# Patient Record
Sex: Female | Born: 1970 | Race: White | Hispanic: No | Marital: Married | Smoking: Never smoker
Health system: Southern US, Community
[De-identification: ages and names within clinical notes are randomized; demographics above are authoritative.]

## PROBLEM LIST (undated history)

## (undated) DIAGNOSIS — R011 Cardiac murmur, unspecified: Secondary | ICD-10-CM

## (undated) DIAGNOSIS — Z8669 Personal history of other diseases of the nervous system and sense organs: Secondary | ICD-10-CM

## (undated) DIAGNOSIS — Z889 Allergy status to unspecified drugs, medicaments and biological substances status: Secondary | ICD-10-CM

## (undated) DIAGNOSIS — T7840XA Allergy, unspecified, initial encounter: Secondary | ICD-10-CM

## (undated) DIAGNOSIS — Z8709 Personal history of other diseases of the respiratory system: Secondary | ICD-10-CM

## (undated) DIAGNOSIS — F419 Anxiety disorder, unspecified: Secondary | ICD-10-CM

## (undated) HISTORY — DX: Cardiac murmur, unspecified: R01.1

## (undated) HISTORY — DX: Personal history of other diseases of the nervous system and sense organs: Z86.69

## (undated) HISTORY — DX: Anxiety disorder, unspecified: F41.9

## (undated) HISTORY — DX: Allergy, unspecified, initial encounter: T78.40XA

## (undated) HISTORY — DX: Allergy status to unspecified drugs, medicaments and biological substances: Z88.9

## (undated) HISTORY — DX: Personal history of other diseases of the respiratory system: Z87.09

## (undated) HISTORY — PX: EYE SURGERY: SHX253

## (undated) HISTORY — PX: NASAL SEPTUM SURGERY: SHX37

---

## 2003-02-14 ENCOUNTER — Other Ambulatory Visit: Admission: RE | Admit: 2003-02-14 | Discharge: 2003-02-14 | Payer: Self-pay | Admitting: Gynecology

## 2004-10-20 ENCOUNTER — Ambulatory Visit: Payer: Self-pay | Admitting: Ophthalmology

## 2007-02-23 ENCOUNTER — Ambulatory Visit: Payer: Self-pay | Admitting: Family Medicine

## 2007-02-28 ENCOUNTER — Ambulatory Visit: Payer: Self-pay | Admitting: Family Medicine

## 2007-02-28 ENCOUNTER — Other Ambulatory Visit: Admission: RE | Admit: 2007-02-28 | Discharge: 2007-02-28 | Payer: Self-pay | Admitting: Family Medicine

## 2007-02-28 ENCOUNTER — Encounter: Payer: Self-pay | Admitting: Family Medicine

## 2007-02-28 DIAGNOSIS — D649 Anemia, unspecified: Secondary | ICD-10-CM | POA: Insufficient documentation

## 2007-03-03 ENCOUNTER — Encounter (INDEPENDENT_AMBULATORY_CARE_PROVIDER_SITE_OTHER): Payer: Self-pay | Admitting: *Deleted

## 2010-06-12 ENCOUNTER — Ambulatory Visit: Payer: Self-pay | Admitting: Family Medicine

## 2010-06-12 DIAGNOSIS — J309 Allergic rhinitis, unspecified: Secondary | ICD-10-CM

## 2010-07-22 ENCOUNTER — Encounter: Payer: Self-pay | Admitting: Family Medicine

## 2010-07-22 ENCOUNTER — Encounter (INDEPENDENT_AMBULATORY_CARE_PROVIDER_SITE_OTHER): Payer: Self-pay | Admitting: *Deleted

## 2010-09-01 ENCOUNTER — Telehealth: Payer: Self-pay | Admitting: Family Medicine
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
## 2010-09-24 NOTE — Letter (Signed)
Summary: Results Follow up Letter  [place] [facility]  [address] [place], [place] [address]   Phone: [phone]  Fax: [phone]    07/22/2010 MRN: [id number]  [facility] [address] [place], [place]  [address]  Dear Maria Thornton,  The following are the results of your recent test(s):  Test         Result    Pap Smear:        Normal __X___  Not Normal _____ Comments: ______________________________________________________ Cholesterol: LDL(Bad cholesterol):         Your goal is less than:         HDL (Good cholesterol):       Your goal is more than: Comments:  ______________________________________________________ Mammogram:        Normal _____  Not Normal _____ Comments:  ___________________________________________________________________ Hemoccult:        Normal _____  Not normal _______ Comments:    _____________________________________________________________________ Other Tests:    We routinely do not discuss normal results over the telephone.  If you desire a copy of the results, or you have any questions about this information we can discuss them at your next office visit.   Sincerely,      Sharilyn Sites for Dr. Roxy Manns

## 2010-09-24 NOTE — Letter (Signed)
Summary: Patient Questionnaire  Patient Questionnaire   Imported By: Beau Fanny 06/12/2010 15:13:14  _____________________________________________________________________  External Attachment:    Type:   Image     Comment:   External Document

## 2010-09-24 NOTE — Miscellaneous (Signed)
  Clinical Lists Changes  Observations: Added new observation of PAP SMEAR: normal (06/12/2010 11:31)      Preventive Care Screening  Pap Smear:    Date:  06/12/2010    Results:  normal

## 2010-09-24 NOTE — Progress Notes (Signed)
Summary: Fluticasone prop 50 mcg spray  Phone Note Refill Request Call back at fax [phone] Message from:  [facility] on September 01, 2010 11:15 AM  Refills Requested: Medication #1:  FLONASE 50 MCG/ACT SUSP 2 sprays in each nostril once daily. [facility] faxed refill request for fluticasone Prop spray #90 day supply. Spoke with [facility] to see if they had rx. Nicki Gu[place]ara said [facility] filled on 06/12/10.Unable to reach pt by phone.Please advise.  Faxed form is on your shelf in the in box.   Method Requested: Fax to Mail Away Pharmacy Initial call taken by: Lewanda Rife LPN,  September 01, 2010 11:16 AM  Follow-up for Phone Call        form done and in nurse in box  Follow-up by: Judith Part MD,  September 01, 2010 11:21 AM  Additional Follow-up for Phone Call Additional follow up Details #1::        completed form faxed to [phone] as instructed.Lewanda Rife LPN  September 01, 2010 2:16 PM     Prescriptions: FLONASE 50 MCG/ACT SUSP (FLUTICASONE PROPIONATE) 2 sprays in each nostril once daily  #3 mdi x 3   Entered and Authorized by:   Judith Part MD   Signed by:   Lewanda Rife LPN on 84/69/6295   Method used:   Historical   RxID:   [id number]

## 2011-06-08 ENCOUNTER — Ambulatory Visit (INDEPENDENT_AMBULATORY_CARE_PROVIDER_SITE_OTHER): Payer: 59 | Admitting: Family Medicine

## 2011-06-08 ENCOUNTER — Encounter: Payer: Self-pay | Admitting: Family Medicine

## 2011-06-08 VITALS — BP 116/72 | HR 80 | Temp 97.8°F | Ht 64.0 in | Wt 164.2 lb

## 2011-06-08 DIAGNOSIS — D649 Anemia, unspecified: Secondary | ICD-10-CM

## 2011-06-08 DIAGNOSIS — F419 Anxiety disorder, unspecified: Secondary | ICD-10-CM | POA: Insufficient documentation

## 2011-06-08 DIAGNOSIS — Z0000 Encounter for general adult medical examination without abnormal findings: Secondary | ICD-10-CM

## 2011-06-08 DIAGNOSIS — R5383 Other fatigue: Secondary | ICD-10-CM | POA: Insufficient documentation

## 2011-06-08 DIAGNOSIS — R5381 Other malaise: Secondary | ICD-10-CM

## 2011-06-08 DIAGNOSIS — R531 Weakness: Secondary | ICD-10-CM | POA: Insufficient documentation

## 2011-06-08 DIAGNOSIS — F411 Generalized anxiety disorder: Secondary | ICD-10-CM

## 2011-06-08 NOTE — Assessment & Plan Note (Signed)
Anemic in past from menses - will re check cbc with diff

## 2011-06-08 NOTE — Patient Instructions (Signed)
We will do a counseling referral at check out  Labs today Start eating breakfast with protein every day  Think about getting back to exercise Try to get to bed earlier- you need more sleep  Follow up with me in about a month  Blood pressure is fine

## 2011-06-08 NOTE — Assessment & Plan Note (Signed)
This may be fueling her episodes of weakness and other physical symptoms Rev to counselor Disc symptoms/ coping skills/ stressors/ tx opt in detail  F/u in about a mo - if not imp consider trial of zoloft again  >25 min spent with face to face with patient, >50% counseling and/or coordinating care

## 2011-06-08 NOTE — Assessment & Plan Note (Signed)
Labs for this and weakness today  Will disc at f/u  Disc need for more sleep and more regular meals and exercise as well
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
## 2011-06-08 NOTE — Assessment & Plan Note (Signed)
See assessment for fatigue and anxity - likely multifactorial Nl exam  Lab today and disc at f/u

## 2011-06-23 ENCOUNTER — Ambulatory Visit (INDEPENDENT_AMBULATORY_CARE_PROVIDER_SITE_OTHER): Payer: 59 | Admitting: Psychology

## 2011-06-23 DIAGNOSIS — F411 Generalized anxiety disorder: Secondary | ICD-10-CM

## 2011-07-07 ENCOUNTER — Ambulatory Visit (INDEPENDENT_AMBULATORY_CARE_PROVIDER_SITE_OTHER): Payer: 59 | Admitting: Psychology

## 2011-07-07 DIAGNOSIS — F411 Generalized anxiety disorder: Secondary | ICD-10-CM

## 2011-07-08 ENCOUNTER — Encounter: Payer: Self-pay | Admitting: Family Medicine

## 2011-07-09 ENCOUNTER — Ambulatory Visit (INDEPENDENT_AMBULATORY_CARE_PROVIDER_SITE_OTHER): Payer: 59 | Admitting: Family Medicine

## 2011-07-09 ENCOUNTER — Encounter: Payer: Self-pay | Admitting: Family Medicine

## 2011-07-09 VITALS — BP 110/78 | HR 68 | Temp 98.1°F | Ht 64.0 in | Wt 164.2 lb

## 2011-07-09 DIAGNOSIS — R531 Weakness: Secondary | ICD-10-CM

## 2011-07-09 DIAGNOSIS — F411 Generalized anxiety disorder: Secondary | ICD-10-CM

## 2011-07-09 DIAGNOSIS — R5381 Other malaise: Secondary | ICD-10-CM

## 2011-07-09 DIAGNOSIS — J309 Allergic rhinitis, unspecified: Secondary | ICD-10-CM

## 2011-07-09 DIAGNOSIS — R42 Dizziness and giddiness: Secondary | ICD-10-CM

## 2011-07-09 DIAGNOSIS — F419 Anxiety disorder, unspecified: Secondary | ICD-10-CM

## 2011-07-09 NOTE — Progress Notes (Signed)
Subjective:    Patient ID: Maria Thornton, female    DOB: 01-18-1971, 40 y.o.   MRN: [id number]  HPI Here for f/u of anxiety and weakness Doing some better  Seeing Dr Laymond Purser  Things at work have settled down too- less stress Is a lifetime worrier- and disc this with the counselor   Still has episodes of dizziness- ?weakness/ wondered about an inner ear problem  Off balance for just a few seconds   no cp or palpitations  ? If positional -- hard to say   Takes flonase -- for her sinuses and that is helpful  No facial pain or fever  Still stays congested a good part of the time / and sometimes dizzy   Full panel of labs from [facility] were all nl - chem and tsh and cbc and lipids  Reviewed those with pt today   Patient Active Problem List  Diagnoses  . ANEMIA NOS  . ALLERGIC RHINITIS  . Fatigue  . Weakness  . Anxiety  . Routine general medical examination at a health care facility  . Dizziness   Past Medical History  Diagnosis Date  . History of asthma     in the past, very mild  . Multiple allergies   . Heart murmur     no proph  . History of Bell's palsy     years ago   Past Surgical History  Procedure Date  . Nasal septum surgery   . Eye surgery   ?   History  Substance Use Topics  . Smoking status: Never Smoker   . Smokeless tobacco: Not on file  . Alcohol Use: Yes   Family History  Problem Relation Age of Onset  . Arthritis Mother     rheumatoid  . Hypertension Mother   . Hypertension Father   . Diabetes Maternal Grandmother   . Arthritis Maternal Grandfather     rheumatoid  . Hyperlipidemia Maternal Grandfather   . Hypertension Maternal Grandfather   . Arthritis Paternal Grandmother    Allergies  Allergen Reactions  . Sulfonamide Derivatives     REACTION: rash   Current Outpatient Prescriptions on File Prior to Visit  Medication Sig Dispense Refill  . clindamycin-benzoyl peroxide (BENZACLIN) gel Apply 1 application topically daily.         . fluticasone (FLONASE) 50 MCG/ACT nasal spray 2 sprays each nostril once daily       . spironolactone (ALDACTONE) 25 MG tablet Take 50 mg by mouth daily.       . Omega-3 Fatty Acids (FISH OIL) 1000 MG CAPS Take 2 capsules by mouth daily when remember           Review of Systems Review of Systems  Constitutional: Negative for fever, appetite change, fatigue and unexpected weight change.  Eyes: Negative for pain and visual disturbance.  ENT pos for congestion/ ear pressure, neg for ST or sinus pain  Respiratory: Negative for cough and shortness of breath.   Cardiovascular: Negative for cp or palpitations    Gastrointestinal: Negative for nausea, diarrhea and constipation.  Genitourinary: Negative for urgency and frequency.  Skin: Negative for pallor or rash   Neurological: Negative for weakness, , numbness and headaches. pos for episodes of light headedness  Hematological: Negative for adenopathy. Does not bruise/bleed easily.  Psychiatric/Behavioral: Negative for dysphoric mood. The patient has some anxiety and stress         Objective:   Physical Exam  Constitutional: She appears well-developed and well-nourished.  No distress.  HENT:  Head: Normocephalic and atraumatic.  Right Ear: External ear normal.  Left Ear: External ear normal.  Mouth/Throat: Oropharynx is clear and moist.       Nares are boggy , not congested TMs clear No sinus tenderness Some post nasal drip  Eyes: Conjunctivae and EOM are normal. Pupils are equal, round, and reactive to light. No scleral icterus.       No nystagmus  Neck: Normal range of motion. Neck supple. No JVD present. Carotid bruit is not present. No thyromegaly present.  Cardiovascular: Normal rate, regular rhythm, normal heart sounds and intact distal pulses.  Exam reveals no gallop.   Pulmonary/Chest: Effort normal and breath sounds normal. No respiratory distress. She has no wheezes.  Abdominal: Soft. Bowel sounds are normal. She exhibits  no distension and no mass. There is no tenderness.  Musculoskeletal: She exhibits no edema.  Lymphadenopathy:    She has no cervical adenopathy.  Neurological: She is alert. She has normal strength and normal reflexes. She displays no atrophy and no tremor. No cranial nerve deficit or sensory deficit. She exhibits normal muscle tone. Coordination and gait normal.  Skin: Skin is warm and dry. No rash noted. No erythema. No pallor.  Psychiatric: She has a normal mood and affect.          Assessment & Plan:

## 2011-07-09 NOTE — Assessment & Plan Note (Signed)
Pt seeing counselor -imp in stress , overall doing better  Will call if worse and consider zoloft

## 2011-07-09 NOTE — Assessment & Plan Note (Signed)
See assessment for dizziness

## 2011-07-09 NOTE — Assessment & Plan Note (Signed)
Intermittent with feeling of leg weakness Pt thinks this is vertigo and rel to allergies ? Still wonder if rel to anx Ref to ENT and update

## 2011-07-09 NOTE — Assessment & Plan Note (Signed)
On flonase- pt still having problems with sinus congestion/some ha and dizziness Ref to ENT

## 2011-07-09 NOTE — Patient Instructions (Signed)
Go ahead and get your eyes checked out  We will refer you to ENT at check out  If symptoms worsen- let me know  If your anxiety worsens- please let me know

## 2011-07-27 ENCOUNTER — Ambulatory Visit: Payer: Self-pay | Admitting: Unknown Physician Specialty

## 2011-08-23 ENCOUNTER — Telehealth: Payer: Self-pay | Admitting: Family Medicine

## 2011-08-23 NOTE — Telephone Encounter (Signed)
Thanks for the update - she can call us back if she changes her mind

## 2011-11-25 ENCOUNTER — Telehealth: Payer: Self-pay | Admitting: Family Medicine

## 2011-11-25 NOTE — Telephone Encounter (Signed)
Caller: Maria Thornton/Patient; PCP: Roxy Manns A.; CB#: [phone] regarding Feels Like Theres A. Lot of Paper Cuts On Hands for the Past Three Days; Sxs onset 11/23/11, feels like glass slivers in hands or paper cuts or very cold hands, is painful.  Goes all the way up to wrist at times, cold makes sxs worse, nothing makes it better.  Both hands, left hand more than right.  All emergent sxs r/o per "Hand Non-Injury" protocol with the exception of "New onset mild to moderate pain that has not improved with 24 hours of home care."  Advised to See Provider within 24 hours.  Attempted to schedule appt in [facility], all providers were full.  Advised to call in AM to see if any appts opened up, to call back sooner if sxs worsen or if concerned.  Verbalizes understanding.

## 2011-11-26 NOTE — Telephone Encounter (Signed)
Patient called and left a message on voicemail stated that she does not need an appointment, she is feeling better.  She is taking Ibuprofen for the pain and it is helping.

## 2011-11-26 NOTE — Telephone Encounter (Signed)
Please schedule her for sat clinic if nothing is available I am out of the office in a conference right now

## 2011-11-26 NOTE — Telephone Encounter (Signed)
Left message on cell phone voicemail for patient to return call. 

## 2012-02-14 ENCOUNTER — Telehealth: Payer: Self-pay | Admitting: Family Medicine

## 2012-02-14 NOTE — Telephone Encounter (Signed)
error 

## 2012-03-17 ENCOUNTER — Other Ambulatory Visit: Payer: Self-pay | Admitting: Family Medicine

## 2012-03-17 ENCOUNTER — Other Ambulatory Visit: Payer: 59

## 2012-03-17 DIAGNOSIS — Z0000 Encounter for general adult medical examination without abnormal findings: Secondary | ICD-10-CM

## 2012-03-17 DIAGNOSIS — Z136 Encounter for screening for cardiovascular disorders: Secondary | ICD-10-CM

## 2012-03-17 DIAGNOSIS — D649 Anemia, unspecified: Secondary | ICD-10-CM

## 2012-03-17 DIAGNOSIS — Z13220 Encounter for screening for lipoid disorders: Secondary | ICD-10-CM

## 2012-03-18 LAB — LIPID PANEL
Cholesterol, Total: 140 mg/dL (ref 100–199)
LDL Calculated: 81 mg/dL (ref 0–99)

## 2012-03-18 LAB — COMPREHENSIVE METABOLIC PANEL
Albumin: 4.4 g/dL (ref 3.5–5.5)
Alkaline Phosphatase: 56 IU/L (ref 42–107)
BUN/Creatinine Ratio: 12 (ref 9–23)
BUN: 10 mg/dL (ref 6–24)
CO2: 23 mmol/L (ref 19–28)
Creatinine, Ser: 0.82 mg/dL (ref 0.57–1.00)
GFR calc non Af Amer: 89 mL/min/{1.73_m2} (ref >59)
Globulin, Total: 2.5 g/dL (ref 1.5–4.5)

## 2012-03-18 LAB — CBC WITH DIFFERENTIAL
Basophils Absolute: 0.1 10*3/uL (ref 0.0–0.2)
Eosinophils Absolute: 0.3 10*3/uL (ref 0.0–0.4)
Immature Grans (Abs): 0 10*3/uL (ref 0.0–0.1)
Immature Granulocytes: 0 % (ref 0–2)
Lymphs: 30 % (ref 14–46)
MCHC: 34.5 g/dL (ref 31.5–35.7)
Neutrophils Absolute: 2.8 10*3/uL (ref 1.8–7.8)
Neutrophils Relative %: 52 % (ref 40–74)
Platelets: 218 10*3/uL (ref 140–415)
RDW: 12.9 % (ref 12.3–15.4)
WBC: 5.4 10*3/uL (ref 4.0–10.5)

## 2012-03-22 ENCOUNTER — Ambulatory Visit (INDEPENDENT_AMBULATORY_CARE_PROVIDER_SITE_OTHER): Payer: 59 | Admitting: Family Medicine

## 2012-03-22 ENCOUNTER — Encounter: Payer: Self-pay | Admitting: Family Medicine

## 2012-03-22 ENCOUNTER — Other Ambulatory Visit (HOSPITAL_COMMUNITY)
Admission: RE | Admit: 2012-03-22 | Discharge: 2012-03-22 | Disposition: A | Discharge status: Home / Self Care | Payer: 59 | Source: Ambulatory Visit | Attending: Family Medicine | Admitting: Family Medicine

## 2012-03-22 VITALS — BP 110/84 | HR 60 | Temp 98.1°F | Ht 64.25 in | Wt 159.0 lb

## 2012-03-22 DIAGNOSIS — Z01419 Encounter for gynecological examination (general) (routine) without abnormal findings: Secondary | ICD-10-CM | POA: Insufficient documentation

## 2012-03-22 DIAGNOSIS — Z0000 Encounter for general adult medical examination without abnormal findings: Secondary | ICD-10-CM

## 2012-03-22 DIAGNOSIS — Z1231 Encounter for screening mammogram for malignant neoplasm of breast: Secondary | ICD-10-CM

## 2012-03-22 DIAGNOSIS — Z1151 Encounter for screening for human papillomavirus (HPV): Secondary | ICD-10-CM | POA: Insufficient documentation

## 2012-03-22 MED ORDER — FLUTICASONE PROPIONATE 50 MCG/ACT NA SUSP: NASAL | Status: DC

## 2012-03-22 NOTE — Patient Instructions (Addendum)
It was nice to meet you. Please stop by to see Shirlee Limerick on your way out to set up your mammogram.  We will call you with your lab results.

## 2012-03-22 NOTE — Progress Notes (Signed)
Subjective:    Patient ID: Maria Thornton, female    DOB: 1971-06-02, 41 y.o.   MRN: [id number]  HPI  G2P2 new to me here for CPX. Has been seeing Dr. Milinda Antis.  No h/o abnormal pap smears. Never had a mammogram. No family h/o breast, uterine, ovarian or cervical cancer.  Husband had a vasectomy.  Allergic rhinitis- would like refill on her flonase.  Feels it is working well.  Anxiety- has been quite stable.  Takes OTC [facility] Wart.  Lab Results  Component Value Date   HDL 50 03/17/2012   LDLCALC 81 03/17/2012   TRIG 47 03/17/2012   CHOLHDL 2.8 03/17/2012   Patient Active Problem List  Diagnosis  . ANEMIA NOS  . ALLERGIC RHINITIS  . Fatigue  . Weakness  . Anxiety  . Routine general medical examination at a health care facility  . Dizziness   Past Medical History  Diagnosis Date  . History of asthma     in the past, very mild  . Multiple allergies   . Heart murmur     no proph  . History of Bell's palsy     years ago   Past Surgical History  Procedure Date  . Nasal septum surgery   . Eye surgery   ?   History  Substance Use Topics  . Smoking status: Never Smoker   . Smokeless tobacco: Not on file  . Alcohol Use: Yes   Family History  Problem Relation Age of Onset  . Arthritis Mother     rheumatoid  . Hypertension Mother   . Hypertension Father   . Diabetes Maternal Grandmother   . Arthritis Maternal Grandfather     rheumatoid  . Hyperlipidemia Maternal Grandfather   . Hypertension Maternal Grandfather   . Arthritis Paternal Grandmother    Allergies  Allergen Reactions  . Sulfonamide Derivatives     REACTION: rash   Current Outpatient Prescriptions on File Prior to Visit  Medication Sig Dispense Refill  . clindamycin-benzoyl peroxide (BENZACLIN) gel Apply 1 application topically daily.       Marland Kitchen spironolactone (ALDACTONE) 25 MG tablet Take 50 mg by mouth daily.       . [place] Johns Wort 300 MG CAPS Take 600 mg by mouth 3 (three) times daily.          Marland Kitchen DISCONTD: fluticasone (FLONASE) 50 MCG/ACT nasal spray 2 sprays each nostril once daily        The PMH, PSH, Social History, Family History, Medications, and allergies have been reviewed in [facility], and have been updated if relevant.   Review of Systems See HPI Patient reports no  vision/ hearing changes,anorexia, weight change, fever ,adenopathy, persistant / recurrent hoarseness, swallowing issues, chest pain, edema,persistant / recurrent cough, hemoptysis, dyspnea(rest, exertional, paroxysmal nocturnal), gastrointestinal  bleeding (melena, rectal bleeding), abdominal pain, excessive heart burn, GU symptoms(dysuria, hematuria, pyuria, voiding/incontinence  Issues) syncope, focal weakness, severe memory loss, concerning skin lesions, depression, anxiety, abnormal bruising/bleeding, major joint swelling, breast masses or abnormal vaginal bleeding.       Objective:   Physical Exam BP 110/84  Pulse 60  Temp 98.1 F (36.7 C)  Ht 5' 4.25" (1.632 m)  Wt 159 lb (72.122 kg)  BMI 27.08 kg/m2  General:  Well-developed,well-nourished,in no acute distress; alert,appropriate and cooperative throughout examination Head:  normocephalic and atraumatic.   Eyes:  vision grossly intact, pupils equal, pupils round, and pupils reactive to light.   Ears:  R ear normal and L  ear normal.   Nose:  no external deformity.   Mouth:  good dentition.   Neck:  No deformities, masses, or tenderness noted. Breasts:  No mass, nodules, thickening, tenderness, bulging, retraction, inflamation, nipple discharge or skin changes noted.   Lungs:  Normal respiratory effort, chest expands symmetrically. Lungs are clear to auscultation, no crackles or wheezes. Heart:  Normal rate and regular rhythm. S1 and S2 normal without gallop, murmur, click, rub or other extra sounds. Abdomen:  Bowel sounds positive,abdomen soft and non-tender without masses, organomegaly or hernias noted. Rectal:  no external abnormalities.   Genitalia:   Pelvic Exam:        External: normal female genitalia without lesions or masses        Vagina: normal without lesions or masses        Cervix: normal without lesions or masses        Adnexa: normal bimanual exam without masses or fullness        Uterus: normal by palpation        Pap smear: performed Msk:  No deformity or scoliosis noted of thoracic or lumbar spine.   Extremities:  No clubbing, cyanosis, edema, or deformity noted with normal full range of motion of all joints.   Neurologic:  alert & oriented X3 and gait normal.   Skin:  Intact without suspicious lesions or rashes Cervical Nodes:  No lymphadenopathy noted Axillary Nodes:  No palpable lymphadenopathy Psych:  Cognition and judgment appear intact. Alert and cooperative with normal attention span and concentration. No apparent delusions, illusions, hallucinations     Assessment & Plan:    1. Other screening mammogram  MM Digital Screening  2. Routine gynecological examination  Pap today.   3. Routine general medical examination at a health care facility   Reviewed preventive care protocols, scheduled due services, and updated immunizations Discussed nutrition, exercise, diet, and healthy lifestyle.

## 2012-03-23 ENCOUNTER — Ambulatory Visit: Payer: Self-pay | Admitting: Family Medicine

## 2012-03-24 ENCOUNTER — Encounter: Payer: Self-pay | Admitting: Family Medicine

## 2012-03-24 ENCOUNTER — Encounter: Payer: Self-pay | Admitting: *Deleted

## 2012-03-30 ENCOUNTER — Encounter: Payer: Self-pay | Admitting: *Deleted

## 2012-05-15 ENCOUNTER — Telehealth: Payer: Self-pay | Admitting: Family Medicine

## 2012-05-15 NOTE — Telephone Encounter (Signed)
Caller: Devon/Patient; Patient Name: Maria Thornton; PCP: Roxy Manns [facility]); Best Callback Phone Number: [phone]; Reason for call: Feeling very anxious about daughter needing to have ACL surgery for the third time and she is wondering if medication can be called in for Anxiety. Last time she was seen in the office she spoke with Dr. Milinda Antis about restarting Antianxiety medication (She has tried Zoloft in the past and it worked well)  and felt she could control symptoms with diet and exercise but recently crying spells and  Anxiousness has been worse. Triage and Care Advice per Anxiety: Panic Attack Protocol and advised to see Provider within 24 hours for "frequent or longer crying spells and or despondent about symptoms". She is wanting medication to be called in since she is busy with daughters appointments/upcoming surgery- uses [facility] on [address] in [place].

## 2012-05-16 MED ORDER — SERTRALINE HCL 50 MG PO TABS: 50.0000 mg | ORAL_TABLET | Freq: Every day | ORAL | Status: DC

## 2012-05-16 NOTE — Telephone Encounter (Signed)
I'm sorry to hear about her daughter having to have another surgery. Rx for Zolort sent to [facility] in [place]. I hope the surgery goes well and please keep [place] posted.

## 2012-05-16 NOTE — Telephone Encounter (Signed)
Advised patient

## 2013-06-04 ENCOUNTER — Encounter: Payer: Self-pay | Admitting: Family Medicine

## 2013-06-25 ENCOUNTER — Other Ambulatory Visit: Payer: Self-pay | Admitting: Family Medicine

## 2013-06-25 NOTE — Telephone Encounter (Signed)
Last office visit 03/22/2012.  Ok to refill?

## 2013-11-26 ENCOUNTER — Ambulatory Visit (INDEPENDENT_AMBULATORY_CARE_PROVIDER_SITE_OTHER): Payer: 59 | Admitting: Family Medicine

## 2013-11-26 ENCOUNTER — Encounter: Payer: Self-pay | Admitting: *Deleted

## 2013-11-26 ENCOUNTER — Encounter: Payer: Self-pay | Admitting: Family Medicine

## 2013-11-26 ENCOUNTER — Ambulatory Visit (INDEPENDENT_AMBULATORY_CARE_PROVIDER_SITE_OTHER)
Admission: RE | Admit: 2013-11-26 | Discharge: 2013-11-26 | Disposition: A | Discharge status: Home / Self Care | Payer: 59 | Source: Ambulatory Visit | Attending: Family Medicine | Admitting: Family Medicine

## 2013-11-26 VITALS — BP 128/78 | HR 72 | Temp 98.2°F | Ht 63.75 in | Wt 172.5 lb

## 2013-11-26 DIAGNOSIS — M549 Dorsalgia, unspecified: Secondary | ICD-10-CM

## 2013-11-26 LAB — POCT URINALYSIS DIPSTICK
BILIRUBIN UA: NEGATIVE
Glucose, UA: NEGATIVE
Ketones, UA: NEGATIVE
Leukocytes, UA: NEGATIVE
NITRITE UA: NEGATIVE
PH UA: 7.5
Protein, UA: NEGATIVE
RBC UA: NEGATIVE
UROBILINOGEN UA: 0.2

## 2013-11-26 NOTE — Progress Notes (Signed)
Subjective:    Patient ID: Maria Thornton, female    DOB: 1971-05-09, 43 y.o.   MRN: [id number]  Back Pain   43 yo whom I have not seen since she established care with me from Dr. Milinda Antis in 02/2012, here for:  Back pain-  Progressive for past 2 months.  Started right lower back.  No known injury.  Now she feels it more in her tail bone and pelvis.  Worse in the mornings and at the end of the day. Hips sometimes hurt, right > left.  No other joints involved.  Did have low back pain after birth of her children.  +strong FH of RA- mom and grandfather.   Lab Results  Component Value Date   HDL 50 03/17/2012   LDLCALC 81 03/17/2012   TRIG 47 03/17/2012   CHOLHDL 2.8 03/17/2012   Patient Active Problem List   Diagnosis Date Noted  . Routine gynecological examination 03/22/2012  . Dizziness 07/09/2011  . Fatigue 06/08/2011  . Weakness 06/08/2011  . Anxiety 06/08/2011  . Routine general medical examination at a health care facility 06/08/2011  . ALLERGIC RHINITIS 06/12/2010  . ANEMIA NOS 02/28/2007   Past Medical History  Diagnosis Date  . History of asthma     in the past, very mild  . Multiple allergies   . Heart murmur     no proph  . History of Bell's palsy     years ago   Past Surgical History  Procedure Laterality Date  . Nasal septum surgery    . Eye surgery    ?   History  Substance Use Topics  . Smoking status: Never Smoker   . Smokeless tobacco: Not on file  . Alcohol Use: Yes   Family History  Problem Relation Age of Onset  . Arthritis Mother     rheumatoid  . Hypertension Mother   . Hypertension Father   . Diabetes Maternal Grandmother   . Arthritis Maternal Grandfather     rheumatoid  . Hyperlipidemia Maternal Grandfather   . Hypertension Maternal Grandfather   . Arthritis Paternal Grandmother    Allergies  Allergen Reactions  . Sulfonamide Derivatives     REACTION: rash   Current Outpatient Prescriptions on File Prior to Visit  Medication Sig  Dispense Refill  . clindamycin-benzoyl peroxide (BENZACLIN) gel Apply 1 application topically daily.       . fluticasone (FLONASE) 50 MCG/ACT nasal spray 2 sprays each nostril once daily 2 sprays each nostril once daily  16 g  3  . sertraline (ZOLOFT) 50 MG tablet take 1 tablet by mouth once daily  30 tablet  3  . spironolactone (ALDACTONE) 25 MG tablet Take 50 mg by mouth daily.        No current facility-administered medications on file prior to visit.   The PMH, PSH, Social History, Family History, Medications, and allergies have been reviewed in [facility], and have been updated if relevant.   Review of Systems  Musculoskeletal: Positive for back pain.   See HPI No radiculopathy No dysuria or urinary incontinence No fevers     Objective:   Physical Exam Ht 5' 3.75" (1.619 m)  Wt 172 lb 8 oz (78.245 kg)  BMI 29.85 kg/m2  LMP 11/05/2013  General:  Well-developed,well-nourished,in no acute distress; alert,appropriate and cooperative throughout examination Head:  normocephalic and atraumatic.   Msk:  No deformity or scoliosis noted of thoracic or lumbar spine.   Extremities:  No clubbing, cyanosis, edema, or  deformity noted with normal full range of motion of all joints.   No tenderness of lumbar spine or coccyx Neg fabers, neg straight leg raise.  Normal gait Neurologic:  alert & oriented X3 and gait normal.   Skin:  Intact without suspicious lesions or rashes Psych:  Cognition and judgment appear intact. Alert and cooperative with normal attention span and concentration. No apparent delusions, illusions, hallucinations     Assessment & Plan:

## 2013-11-26 NOTE — Addendum Note (Signed)
Addended by: Baldomero LamyHAVERS, NATASHA C on: 11/26/2013 10:53 AM   Modules accepted: Orders

## 2013-11-26 NOTE — Assessment & Plan Note (Addendum)
?  ankylosing spondylitis Will check labs and xray today. The patient indicates understanding of these issues and agrees with the plan.  Orders Placed This Encounter  Procedures  . DG Sacrum/Coccyx  . Sedimentation Rate  . High sensitivity CRP  . Rheumatoid Factor  . Cyclic Citrul Peptide Antibody, IGG  . Urinalysis Dipstick

## 2013-11-26 NOTE — Progress Notes (Signed)
Pre visit review using our clinic review tool, if applicable. No additional management support is needed unless otherwise documented below in the visit note. 

## 2013-11-26 NOTE — Patient Instructions (Signed)
Great to see you. I will call you with your lab and xray results.    Please schedule a physical at your convenience.

## 2013-11-28 ENCOUNTER — Telehealth: Payer: Self-pay

## 2013-11-28 NOTE — Telephone Encounter (Signed)
[facility]; was unable to do sed rate due to age of specimen.

## 2013-11-28 NOTE — Telephone Encounter (Signed)
There was a problem with [facility] picking up her samples. Blood was to pld for the sed rate to be done.

## 2013-11-28 NOTE — Telephone Encounter (Signed)
Camelia Engerri, what is this message about?

## 2013-11-29 LAB — SEDIMENTATION RATE

## 2013-11-29 LAB — RHEUMATOID FACTOR: RHEUMATOID FACTOR: 9.5 [IU]/mL (ref 0.0–13.9)

## 2013-11-29 LAB — HIGH SENSITIVITY CRP: CRP HIGH SENSITIVITY: 0.28 mg/L (ref 0.00–3.00)

## 2013-11-29 LAB — CYCLIC CITRUL PEPTIDE ANTIBODY, IGG/IGA: Cyclic Citrullin Peptide Ab: 5 units (ref 0–19)

## 2014-02-18 ENCOUNTER — Ambulatory Visit (INDEPENDENT_AMBULATORY_CARE_PROVIDER_SITE_OTHER): Payer: 59 | Admitting: Family Medicine

## 2014-02-18 ENCOUNTER — Encounter: Payer: Self-pay | Admitting: Family Medicine

## 2014-02-18 ENCOUNTER — Other Ambulatory Visit (HOSPITAL_COMMUNITY)
Admission: RE | Admit: 2014-02-18 | Discharge: 2014-02-18 | Disposition: A | Discharge status: Home / Self Care | Payer: 59 | Source: Ambulatory Visit | Attending: Family Medicine | Admitting: Family Medicine

## 2014-02-18 VITALS — BP 122/70 | HR 78 | Temp 98.2°F | Ht 64.0 in | Wt 168.8 lb

## 2014-02-18 DIAGNOSIS — Z1231 Encounter for screening mammogram for malignant neoplasm of breast: Secondary | ICD-10-CM

## 2014-02-18 DIAGNOSIS — Z0000 Encounter for general adult medical examination without abnormal findings: Secondary | ICD-10-CM

## 2014-02-18 DIAGNOSIS — Z01419 Encounter for gynecological examination (general) (routine) without abnormal findings: Secondary | ICD-10-CM

## 2014-02-18 DIAGNOSIS — F419 Anxiety disorder, unspecified: Secondary | ICD-10-CM

## 2014-02-18 DIAGNOSIS — Z136 Encounter for screening for cardiovascular disorders: Secondary | ICD-10-CM

## 2014-02-18 DIAGNOSIS — F411 Generalized anxiety disorder: Secondary | ICD-10-CM

## 2014-02-18 DIAGNOSIS — J309 Allergic rhinitis, unspecified: Secondary | ICD-10-CM

## 2014-02-18 MED ORDER — SERTRALINE HCL 50 MG PO TABS: ORAL_TABLET | ORAL | Status: DC

## 2014-02-18 MED ORDER — FLUTICASONE PROPIONATE 50 MCG/ACT NA SUSP: NASAL | Status: AC

## 2014-02-18 NOTE — Assessment & Plan Note (Signed)
Continue current dose of Zoloft. eRx sent.

## 2014-02-18 NOTE — Assessment & Plan Note (Signed)
Pap smear today. Mammogram ordered- she will call [facility] to set up.

## 2014-02-18 NOTE — Assessment & Plan Note (Signed)
Reviewed preventive care protocols, scheduled due services, and updated immunizations Discussed nutrition, exercise, diet, and healthy lifestyle.  Orders Placed This Encounter  Procedures  . MM Digital Screening  . CBC with Differential  . Comprehensive metabolic panel  . Lipid panel  . TSH     

## 2014-02-18 NOTE — Addendum Note (Signed)
Addended by: Baldomero LamyHAVERS, NATASHA C on: 02/18/2014 09:34 AM   Modules accepted: Orders

## 2014-02-18 NOTE — Addendum Note (Signed)
Addended by: Baldomero LamyHAVERS, NATASHA C on: 02/18/2014 09:47 AM   Modules accepted: Orders

## 2014-02-18 NOTE — Progress Notes (Signed)
Pre visit review using our clinic review tool, if applicable. No additional management support is needed unless otherwise documented below in the visit note. 

## 2014-02-18 NOTE — Patient Instructions (Signed)
It was great to see you. We will call you with your lab results.  Please call [facility] to set up your mammogram.

## 2014-02-18 NOTE — Progress Notes (Signed)
Subjective:    Patient ID: Maria GandyJennifer W Thornton, female    DOB: 26-Dec-1970, 43 y.o.   MRN: [id number]  HPI  Pleasant 43 yo G2P2 here for CPX.   No h/o abnormal pap smears. Last pap smear 03/22/12. Has only had one mammogram (2013). No family h/o breast, uterine, ovarian or cervical cancer.  Husband had a vasectomy.  Allergic rhinitis- would like refill on her flonase.  Feels it is working well.  Anxiety- has been stable on current dose of Zoloft.  Back pain has improved since she restarted Zoloft as well.  Lab Results  Component Value Date   HDL 50 03/17/2012   LDLCALC 81 03/17/2012   TRIG 47 03/17/2012   CHOLHDL 2.8 03/17/2012   Patient Active Problem List   Diagnosis Date Noted  . Back pain 11/26/2013  . Routine gynecological examination 03/22/2012  . Dizziness 07/09/2011  . Fatigue 06/08/2011  . Weakness 06/08/2011  . Anxiety 06/08/2011  . Routine general medical examination at a health care facility 06/08/2011  . ALLERGIC RHINITIS 06/12/2010  . ANEMIA NOS 02/28/2007   Past Medical History  Diagnosis Date  . History of asthma     in the past, very mild  . Multiple allergies   . Heart murmur     no proph  . History of Bell's palsy     years ago   Past Surgical History  Procedure Laterality Date  . Nasal septum surgery    . Eye surgery    ?   History  Substance Use Topics  . Smoking status: Never Smoker   . Smokeless tobacco: Not on file  . Alcohol Use: Yes   Family History  Problem Relation Age of Onset  . Arthritis Mother     rheumatoid  . Hypertension Mother   . Hypertension Father   . Diabetes Maternal Grandmother   . Arthritis Maternal Grandfather     rheumatoid  . Hyperlipidemia Maternal Grandfather   . Hypertension Maternal Grandfather   . Arthritis Paternal Grandmother    Allergies  Allergen Reactions  . Sulfonamide Derivatives     REACTION: rash   Current Outpatient Prescriptions on File Prior to Visit  Medication Sig Dispense Refill  .  clindamycin-benzoyl peroxide (BENZACLIN) gel Apply 1 application topically daily.        No current facility-administered medications on file prior to visit.   The PMH, PSH, Social History, Family History, Medications, and allergies have been reviewed in [facility], and have been updated if relevant.   Review of Systems See HPI Patient reports no  vision/ hearing changes,anorexia, weight change, fever ,adenopathy, persistant / recurrent hoarseness, swallowing issues, chest pain, edema,persistant / recurrent cough, hemoptysis, dyspnea(rest, exertional, paroxysmal nocturnal), gastrointestinal  bleeding (melena, rectal bleeding), abdominal pain, excessive heart burn, GU symptoms(dysuria, hematuria, pyuria, voiding/incontinence  Issues) syncope, focal weakness, severe memory loss, concerning skin lesions, depression, anxiety, abnormal bruising/bleeding, major joint swelling, breast masses or abnormal vaginal bleeding.       Objective:   Physical Exam BP 122/70  Pulse 78  Temp(Src) 98.2 F (36.8 C) (Oral)  Ht 5\' 4"  (1.626 m)  Wt 168 lb 12 oz (76.544 kg)  BMI 28.95 kg/m2  SpO2 98%  LMP 02/12/2014 Wt Readings from Last 3 Encounters:  02/18/14 168 lb 12 oz (76.544 kg)  11/26/13 172 lb 8 oz (78.245 kg)  03/22/12 159 lb (72.122 kg)     General:  Well-developed,well-nourished,in no acute distress; alert,appropriate and cooperative throughout examination Head:  normocephalic and atraumatic.  Eyes:  vision grossly intact, pupils equal, pupils round, and pupils reactive to light.   Ears:  R ear normal and L ear normal.   Nose:  no external deformity.   Mouth:  good dentition.   Neck:  No deformities, masses, or tenderness noted. Breasts:  No mass, nodules, thickening, tenderness, bulging, retraction, inflamation, nipple discharge or skin changes noted.   Lungs:  Normal respiratory effort, chest expands symmetrically. Lungs are clear to auscultation, no crackles or wheezes. Heart:  Normal rate and  regular rhythm. S1 and S2 normal without gallop, murmur, click, rub or other extra sounds. Abdomen:  Bowel sounds positive,abdomen soft and non-tender without masses, organomegaly or hernias noted. Rectal:  no external abnormalities.   Genitalia:  Pelvic Exam:        External: normal female genitalia without lesions or masses        Vagina: normal without lesions or masses        Cervix: normal without lesions or masses        Adnexa: normal bimanual exam without masses or fullness        Uterus: normal by palpation        Pap smear: performed Msk:  No deformity or scoliosis noted of thoracic or lumbar spine.   Extremities:  No clubbing, cyanosis, edema, or deformity noted with normal full range of motion of all joints.   Neurologic:  alert & oriented X3 and gait normal.   Skin:  Intact without suspicious lesions or rashes Cervical Nodes:  No lymphadenopathy noted Axillary Nodes:  No palpable lymphadenopathy Psych:  Cognition and judgment appear intact. Alert and cooperative with normal attention span and concentration. No apparent delusions, illusions, hallucinations     Assessment & Plan:

## 2014-02-19 ENCOUNTER — Encounter: Payer: Self-pay | Admitting: *Deleted

## 2014-02-19 LAB — CBC WITH DIFFERENTIAL/PLATELET
Basophils Absolute: 0.1 10*3/uL (ref 0.0–0.2)
Basos: 1 %
Eos: 5 %
Eosinophils Absolute: 0.3 10*3/uL (ref 0.0–0.4)
HCT: 39.7 % (ref 34.0–46.6)
Hemoglobin: 13.2 g/dL (ref 11.1–15.9)
IMMATURE GRANS (ABS): 0 10*3/uL (ref 0.0–0.1)
Immature Granulocytes: 0 %
Lymphocytes Absolute: 1.5 10*3/uL (ref 0.7–3.1)
Lymphs: 30 %
MCH: 30.8 pg (ref 26.6–33.0)
MCHC: 33.2 g/dL (ref 31.5–35.7)
MCV: 93 fL (ref 79–97)
MONOS ABS: 0.4 10*3/uL (ref 0.1–0.9)
Monocytes: 7 %
NEUTROS PCT: 57 %
Neutrophils Absolute: 2.9 10*3/uL (ref 1.4–7.0)
RBC: 4.29 x10E6/uL (ref 3.77–5.28)
RDW: 12.7 % (ref 12.3–15.4)
WBC: 5.1 10*3/uL (ref 3.4–10.8)

## 2014-02-19 LAB — LIPID PANEL
CHOL/HDL RATIO: 3 ratio (ref 0.0–4.4)
Cholesterol, Total: 166 mg/dL (ref 100–199)
HDL: 56 mg/dL (ref >39)
LDL Calculated: 99 mg/dL (ref 0–99)
Triglycerides: 57 mg/dL (ref 0–149)
VLDL CHOLESTEROL CAL: 11 mg/dL (ref 5–40)

## 2014-02-19 LAB — COMPREHENSIVE METABOLIC PANEL
ALBUMIN: 4.5 g/dL (ref 3.5–5.5)
ALT: 25 IU/L (ref 0–32)
AST: 22 IU/L (ref 0–40)
Albumin/Globulin Ratio: 2 (ref 1.1–2.5)
Alkaline Phosphatase: 62 IU/L (ref 39–117)
BUN/Creatinine Ratio: 10 (ref 9–23)
BUN: 8 mg/dL (ref 6–24)
CALCIUM: 9.9 mg/dL (ref 8.7–10.2)
CHLORIDE: 102 mmol/L (ref 97–108)
CO2: 23 mmol/L (ref 18–29)
Creatinine, Ser: 0.81 mg/dL (ref 0.57–1.00)
GFR calc Af Amer: 103 mL/min/{1.73_m2} (ref >59)
GFR, EST NON AFRICAN AMERICAN: 89 mL/min/{1.73_m2} (ref >59)
GLUCOSE: 95 mg/dL (ref 65–99)
Globulin, Total: 2.3 g/dL (ref 1.5–4.5)
Potassium: 4.3 mmol/L (ref 3.5–5.2)
Sodium: 141 mmol/L (ref 134–144)
TOTAL PROTEIN: 6.8 g/dL (ref 6.0–8.5)
Total Bilirubin: 0.7 mg/dL (ref 0.0–1.2)

## 2014-02-19 LAB — TSH: TSH: 1.07 u[IU]/mL (ref 0.450–4.500)

## 2014-02-19 LAB — CYTOLOGY - PAP

## 2014-02-20 ENCOUNTER — Encounter: Payer: Self-pay | Admitting: *Deleted

## 2014-03-26 ENCOUNTER — Encounter: Payer: Self-pay | Admitting: Family Medicine

## 2014-03-26 ENCOUNTER — Ambulatory Visit: Payer: Self-pay | Admitting: Family Medicine

## 2014-08-13 ENCOUNTER — Ambulatory Visit: Payer: 59 | Admitting: Family Medicine

## 2014-12-02 ENCOUNTER — Other Ambulatory Visit: Payer: Self-pay | Admitting: Family Medicine

## 2014-12-10 ENCOUNTER — Other Ambulatory Visit: Payer: Self-pay

## 2014-12-10 NOTE — Telephone Encounter (Signed)
Pt left v/m; pt has CPX scheduled on 07/215/16 and pt request refill zoloft until seen.Please advise.

## 2014-12-11 MED ORDER — SERTRALINE HCL 50 MG PO TABS: 50.0000 mg | ORAL_TABLET | Freq: Every day | ORAL | Status: DC

## 2015-03-05 ENCOUNTER — Other Ambulatory Visit: Payer: Self-pay | Admitting: Family Medicine

## 2015-03-05 DIAGNOSIS — Z0000 Encounter for general adult medical examination without abnormal findings: Secondary | ICD-10-CM

## 2015-03-11 ENCOUNTER — Other Ambulatory Visit (INDEPENDENT_AMBULATORY_CARE_PROVIDER_SITE_OTHER): Payer: 59

## 2015-03-11 DIAGNOSIS — Z0000 Encounter for general adult medical examination without abnormal findings: Secondary | ICD-10-CM

## 2015-03-12 LAB — CBC WITH DIFFERENTIAL/PLATELET
BASOS ABS: 0.1 10*3/uL (ref 0.0–0.2)
BASOS: 1 %
EOS (ABSOLUTE): 0.2 10*3/uL (ref 0.0–0.4)
Eos: 4 %
Hematocrit: 34.9 % (ref 34.0–46.6)
Hemoglobin: 11.1 g/dL (ref 11.1–15.9)
Immature Grans (Abs): 0 10*3/uL (ref 0.0–0.1)
Immature Granulocytes: 0 %
LYMPHS: 27 %
Lymphocytes Absolute: 1.5 10*3/uL (ref 0.7–3.1)
MCH: 26.6 pg (ref 26.6–33.0)
MCHC: 31.8 g/dL (ref 31.5–35.7)
MCV: 84 fL (ref 79–97)
MONOCYTES: 11 %
MONOS ABS: 0.6 10*3/uL (ref 0.1–0.9)
NEUTROS ABS: 3.3 10*3/uL (ref 1.4–7.0)
NEUTROS PCT: 57 %
Platelets: 273 10*3/uL (ref 150–379)
RBC: 4.18 x10E6/uL (ref 3.77–5.28)
RDW: 16.2 % — AB (ref 12.3–15.4)
WBC: 5.7 10*3/uL (ref 3.4–10.8)

## 2015-03-12 LAB — COMPREHENSIVE METABOLIC PANEL
A/G RATIO: 2.2 (ref 1.1–2.5)
ALT: 23 IU/L (ref 0–32)
AST: 25 IU/L (ref 0–40)
Albumin: 4.4 g/dL (ref 3.5–5.5)
Alkaline Phosphatase: 59 IU/L (ref 39–117)
BUN/Creatinine Ratio: 17 (ref 9–23)
BUN: 13 mg/dL (ref 6–24)
Bilirubin Total: 0.2 mg/dL (ref 0.0–1.2)
CO2: 21 mmol/L (ref 18–29)
CREATININE: 0.78 mg/dL (ref 0.57–1.00)
Calcium: 9.8 mg/dL (ref 8.7–10.2)
Chloride: 102 mmol/L (ref 97–108)
GFR calc Af Amer: 107 mL/min/{1.73_m2} (ref >59)
GFR calc non Af Amer: 93 mL/min/{1.73_m2} (ref >59)
Globulin, Total: 2 g/dL (ref 1.5–4.5)
Glucose: 90 mg/dL (ref 65–99)
Potassium: 4.7 mmol/L (ref 3.5–5.2)
SODIUM: 137 mmol/L (ref 134–144)
Total Protein: 6.4 g/dL (ref 6.0–8.5)

## 2015-03-12 LAB — LIPID PANEL
CHOL/HDL RATIO: 2.6 ratio (ref 0.0–4.4)
Cholesterol, Total: 156 mg/dL (ref 100–199)
HDL: 59 mg/dL (ref >39)
LDL CALC: 81 mg/dL (ref 0–99)
TRIGLYCERIDES: 80 mg/dL (ref 0–149)
VLDL Cholesterol Cal: 16 mg/dL (ref 5–40)

## 2015-03-12 LAB — TSH: TSH: 1.14 u[IU]/mL (ref 0.450–4.500)

## 2015-03-17 ENCOUNTER — Ambulatory Visit (INDEPENDENT_AMBULATORY_CARE_PROVIDER_SITE_OTHER): Payer: 59 | Admitting: Family Medicine

## 2015-03-17 ENCOUNTER — Telehealth: Payer: Self-pay | Admitting: Family Medicine

## 2015-03-17 ENCOUNTER — Encounter: Payer: Self-pay | Admitting: Family Medicine

## 2015-03-17 VITALS — BP 118/78 | HR 94 | Temp 98.4°F | Ht 64.0 in | Wt 166.5 lb

## 2015-03-17 DIAGNOSIS — Z021 Encounter for pre-employment examination: Secondary | ICD-10-CM

## 2015-03-17 DIAGNOSIS — Z0000 Encounter for general adult medical examination without abnormal findings: Secondary | ICD-10-CM

## 2015-03-17 DIAGNOSIS — L709 Acne, unspecified: Secondary | ICD-10-CM | POA: Diagnosis not present

## 2015-03-17 DIAGNOSIS — F419 Anxiety disorder, unspecified: Secondary | ICD-10-CM | POA: Diagnosis not present

## 2015-03-17 DIAGNOSIS — J3089 Other allergic rhinitis: Secondary | ICD-10-CM

## 2015-03-17 MED ORDER — CENTRUM ADULTS PO TABS: 1.0000 | ORAL_TABLET | Freq: Every day | ORAL | Status: DC

## 2015-03-17 NOTE — Assessment & Plan Note (Signed)
Well controlled on zoloft 50 mg daily. No changes made today.

## 2015-03-17 NOTE — Patient Instructions (Addendum)
Please call to schedule your mammogram after 03/27/15.  Great to see you!  I am sorry about your friend.  Please let me know if I can do anything.

## 2015-03-17 NOTE — Progress Notes (Signed)
Subjective:    Patient ID: Maria Thornton, female    DOB: [birth year], 43 y.o.   MRN: [id number]  HPI  Pleasant 44 yo G2P2 here for CPX and follow up of chronic medical conditions.   No h/o abnormal pap smears. Last pap smear 02/18/14- done by me.  Mammogram 03/26/14 No family h/o breast, uterine, ovarian or cervical cancer.  Husband had a vasectomy.  Allergic rhinitis- would like refill on her flonase.  Feels it is working well.  Anxiety- has been stable on current dose of Zoloft 50 mg daily.    Daughters are going to [facility] in a few weeks but they will be together which she feels better about.  A little sad but happy for them.    Acne- seeing derm.  Pleased with results of combination of oral aldactone, acticlate and topical benzacilin gel. Lab Results  Component Value Date   CHOL 156 03/11/2015   HDL 59 03/11/2015   LDLCALC 81 03/11/2015   TRIG 80 03/11/2015   CHOLHDL 2.6 03/11/2015   Lab Results  Component Value Date   CREATININE 0.78 03/11/2015   Lab Results  Component Value Date   NA 137 03/11/2015   K 4.7 03/11/2015   CL 102 03/11/2015   CO2 21 03/11/2015   Lab Results  Component Value Date   ALT 23 03/11/2015   AST 25 03/11/2015   ALKPHOS 59 03/11/2015   BILITOT 0.2 03/11/2015   Lab Results  Component Value Date   WBC 5.7 03/11/2015   HGB 13.2 02/18/2014   HCT 34.9 03/11/2015   MCV 93 02/18/2014   PLT 218 03/17/2012    Patient Active Problem List   Diagnosis Date Noted  . Acne 03/17/2015  . Back pain 11/26/2013  . Routine gynecological examination 03/22/2012  . Dizziness 07/09/2011  . Fatigue 06/08/2011  . Weakness 06/08/2011  . Anxiety 06/08/2011  . Routine general medical examination at a health care facility 06/08/2011  . ALLERGIC RHINITIS 06/12/2010  . Anemia 02/28/2007   Past Medical History  Diagnosis Date  . History of asthma     in the past, very mild  . Multiple allergies   . Heart murmur     no proph  . History of Bell's  palsy     years ago   Past Surgical History  Procedure Laterality Date  . Nasal septum surgery    . Eye surgery    ?   History  Substance Use Topics  . Smoking status: Never Smoker   . Smokeless tobacco: Not on file  . Alcohol Use: Yes   Family History  Problem Relation Age of Onset  . Arthritis Mother     rheumatoid  . Hypertension Mother   . Hypertension Father   . Diabetes Maternal Grandmother   . Arthritis Maternal Grandfather     rheumatoid  . Hyperlipidemia Maternal Grandfather   . Hypertension Maternal Grandfather   . Arthritis Paternal Grandmother    Allergies  Allergen Reactions  . Sulfonamide Derivatives     REACTION: rash   Current Outpatient Prescriptions on File Prior to Visit  Medication Sig Dispense Refill  . clindamycin-benzoyl peroxide (BENZACLIN) gel Apply 1 application topically daily.     . fluticasone (FLONASE) 50 MCG/ACT nasal spray 2 sprays each nostril once daily 2 sprays each nostril once daily 16 g 3  . sertraline (ZOLOFT) 50 MG tablet Take 1 tablet (50 mg total) by mouth daily. 30 tablet 1  . tazarotene (AVAGE) 0.1 %  cream Apply topically at bedtime.     No current facility-administered medications on file prior to visit.   The PMH, PSH, Social History, Family History, Medications, and allergies have been reviewed in [facility], and have been updated if relevant.   Review of Systems  Constitutional: Negative.   HENT: Negative.   Respiratory: Negative.   Cardiovascular: Negative.   Gastrointestinal: Negative.   Endocrine: Negative.   Genitourinary: Negative.   Musculoskeletal: Negative.   Skin: Negative.   Allergic/Immunologic: Negative.   Neurological: Negative.   Hematological: Negative.   Psychiatric/Behavioral: Negative.   All other systems reviewed and are negative.       Objective:   Physical Exam BP 118/78 mmHg  Pulse 94  Temp(Src) 98.4 F (36.9 C) (Oral)  Ht 5\' 4"  (1.626 m)  Wt 166 lb 8 oz (75.524 kg)  BMI 28.57 kg/m2   SpO2 98%  LMP 02/22/2015 Wt Readings from Last 3 Encounters:  03/17/15 166 lb 8 oz (75.524 kg)  02/18/14 168 lb 12 oz (76.544 kg)  11/26/13 172 lb 8 oz (78.245 kg)     General:  Well-developed,well-nourished,in no acute distress; alert,appropriate and cooperative throughout examination Head:  normocephalic and atraumatic.   Eyes:  vision grossly intact, pupils equal, pupils round, and pupils reactive to light.   Ears:  R ear normal and L ear normal.   Nose:  no external deformity.   Mouth:  good dentition.   Neck:  No deformities, masses, or tenderness noted. Breasts:  No mass, nodules, thickening, tenderness, bulging, retraction, inflamation, nipple discharge or skin changes noted.   Lungs:  Normal respiratory effort, chest expands symmetrically. Lungs are clear to auscultation, no crackles or wheezes. Heart:  Normal rate and regular rhythm. S1 and S2 normal without gallop, murmur, click, rub or other extra sounds. Abdomen:  Bowel sounds positive,abdomen soft and non-tender without masses, organomegaly or hernias noted. Msk:  No deformity or scoliosis noted of thoracic or lumbar spine.   Extremities:  No clubbing, cyanosis, edema, or deformity noted with normal full range of motion of all joints.   Neurologic:  alert & oriented X3 and gait normal.   Skin:  Intact without suspicious lesions or rashes Cervical Nodes:  No lymphadenopathy noted Axillary Nodes:  No palpable lymphadenopathy Psych:  Cognition and judgment appear intact. Alert and cooperative with normal attention span and concentration. No apparent delusions, illusions, hallucinations     Assessment & Plan:

## 2015-03-17 NOTE — Assessment & Plan Note (Signed)
Reviewed preventive care protocols, scheduled due services, and updated immunizations Discussed nutrition, exercise, diet, and healthy lifestyle.  

## 2015-03-17 NOTE — Assessment & Plan Note (Signed)
Followed by derm. Please with current rxs. No changes made today.

## 2015-03-17 NOTE — Assessment & Plan Note (Signed)
Flonase working well.  No changes made today.

## 2015-03-17 NOTE — Telephone Encounter (Signed)
Order placed

## 2015-03-17 NOTE — Telephone Encounter (Signed)
Pt would like to have tobacco screening lab orders placed so she can use for work.  Best number to call to schedule is [phone]

## 2015-03-17 NOTE — Telephone Encounter (Signed)
Left message for pt to call to schedule non-fasting labs she requested / lt

## 2015-03-21 ENCOUNTER — Other Ambulatory Visit (INDEPENDENT_AMBULATORY_CARE_PROVIDER_SITE_OTHER): Payer: 59

## 2015-03-21 ENCOUNTER — Telehealth: Payer: Self-pay | Admitting: Family Medicine

## 2015-03-21 DIAGNOSIS — Z021 Encounter for pre-employment examination: Secondary | ICD-10-CM

## 2015-03-21 NOTE — Telephone Encounter (Signed)
Form place in Dr Elmer Sow inbox for review and completion

## 2015-03-21 NOTE — Telephone Encounter (Signed)
rounting to waynetta, sent to [facility] by mistake

## 2015-03-21 NOTE — Telephone Encounter (Signed)
Pt dropped off form that is needed for work.  Placing on Shap[facility]'s desk. Please call [phone]. Thanks

## 2015-03-24 DIAGNOSIS — Z7689 Persons encountering health services in other specified circumstances: Secondary | ICD-10-CM

## 2015-03-26 ENCOUNTER — Encounter: Payer: Self-pay | Admitting: *Deleted

## 2015-03-26 LAB — NICOTINE/COTININE METABOLITES
COTININE: NOT DETECTED ng/mL
NICOTINE: NOT DETECTED ng/mL

## 2015-04-13 ENCOUNTER — Other Ambulatory Visit: Payer: Self-pay | Admitting: Family Medicine

## 2015-04-13 ENCOUNTER — Encounter: Payer: Self-pay | Admitting: Family Medicine

## 2015-04-14 MED ORDER — SERTRALINE HCL 50 MG PO TABS: 50.0000 mg | ORAL_TABLET | Freq: Every day | ORAL | Status: DC

## 2015-07-08 ENCOUNTER — Other Ambulatory Visit: Payer: Self-pay | Admitting: Family Medicine

## 2015-07-08 DIAGNOSIS — Z1231 Encounter for screening mammogram for malignant neoplasm of breast: Secondary | ICD-10-CM

## 2015-07-09 ENCOUNTER — Ambulatory Visit
Admission: RE | Admit: 2015-07-09 | Discharge: 2015-07-09 | Disposition: A | Discharge status: Home / Self Care | Payer: 59 | Source: Ambulatory Visit | Attending: Family Medicine | Admitting: Family Medicine

## 2015-07-09 DIAGNOSIS — Z1231 Encounter for screening mammogram for malignant neoplasm of breast: Secondary | ICD-10-CM | POA: Diagnosis present

## 2015-07-10 ENCOUNTER — Other Ambulatory Visit: Payer: Self-pay | Admitting: Family Medicine

## 2015-07-10 DIAGNOSIS — R928 Other abnormal and inconclusive findings on diagnostic imaging of breast: Secondary | ICD-10-CM

## 2015-07-25 ENCOUNTER — Ambulatory Visit
Admission: RE | Admit: 2015-07-25 | Discharge: 2015-07-25 | Disposition: A | Discharge status: Home / Self Care | Payer: 59 | Source: Ambulatory Visit | Attending: Family Medicine | Admitting: Family Medicine

## 2015-07-25 DIAGNOSIS — N6001 Solitary cyst of right breast: Secondary | ICD-10-CM | POA: Diagnosis not present

## 2015-07-25 DIAGNOSIS — R928 Other abnormal and inconclusive findings on diagnostic imaging of breast: Secondary | ICD-10-CM | POA: Diagnosis present

## 2016-02-29 ENCOUNTER — Other Ambulatory Visit: Payer: Self-pay | Admitting: Family Medicine

## 2016-03-26 ENCOUNTER — Other Ambulatory Visit: Payer: Self-pay | Admitting: Family Medicine

## 2016-03-26 DIAGNOSIS — Z0000 Encounter for general adult medical examination without abnormal findings: Secondary | ICD-10-CM

## 2016-04-01 ENCOUNTER — Other Ambulatory Visit (INDEPENDENT_AMBULATORY_CARE_PROVIDER_SITE_OTHER): Payer: 59

## 2016-04-01 DIAGNOSIS — Z0000 Encounter for general adult medical examination without abnormal findings: Secondary | ICD-10-CM

## 2016-04-01 NOTE — Addendum Note (Signed)
Addended by: Baldomero LamyHAVERS, Cumi Sanagustin C on: 04/01/2016 09:39 AM   Modules accepted: Orders

## 2016-04-02 LAB — CBC WITH DIFFERENTIAL/PLATELET
BASOS ABS: 0.1 10*3/uL (ref 0.0–0.2)
Basos: 3 %
EOS (ABSOLUTE): 0.3 10*3/uL (ref 0.0–0.4)
Eos: 7 %
Hematocrit: 35.7 % (ref 34.0–46.6)
Hemoglobin: 11.3 g/dL (ref 11.1–15.9)
Immature Grans (Abs): 0 10*3/uL (ref 0.0–0.1)
Immature Granulocytes: 0 %
LYMPHS ABS: 1.7 10*3/uL (ref 0.7–3.1)
Lymphs: 36 %
MCH: 26.8 pg (ref 26.6–33.0)
MCHC: 31.7 g/dL (ref 31.5–35.7)
MCV: 85 fL (ref 79–97)
MONOCYTES: 12 %
MONOS ABS: 0.5 10*3/uL (ref 0.1–0.9)
NEUTROS ABS: 2 10*3/uL (ref 1.4–7.0)
Neutrophils: 42 %
PLATELETS: 275 10*3/uL (ref 150–379)
RBC: 4.21 x10E6/uL (ref 3.77–5.28)
RDW: 14.6 % (ref 12.3–15.4)
WBC: 4.7 10*3/uL (ref 3.4–10.8)

## 2016-04-02 LAB — COMPREHENSIVE METABOLIC PANEL
ALK PHOS: 60 IU/L (ref 39–117)
ALT: 27 IU/L (ref 0–32)
AST: 27 IU/L (ref 0–40)
Albumin/Globulin Ratio: 1.7 (ref 1.2–2.2)
Albumin: 4.2 g/dL (ref 3.5–5.5)
BUN / CREAT RATIO: 15 (ref 9–23)
BUN: 12 mg/dL (ref 6–24)
Bilirubin Total: 0.2 mg/dL (ref 0.0–1.2)
CO2: 23 mmol/L (ref 18–29)
CREATININE: 0.79 mg/dL (ref 0.57–1.00)
Calcium: 9.5 mg/dL (ref 8.7–10.2)
Chloride: 103 mmol/L (ref 96–106)
GFR calc Af Amer: 105 mL/min/{1.73_m2} (ref >59)
GFR calc non Af Amer: 91 mL/min/{1.73_m2} (ref >59)
GLUCOSE: 98 mg/dL (ref 65–99)
Globulin, Total: 2.5 g/dL (ref 1.5–4.5)
POTASSIUM: 4.8 mmol/L (ref 3.5–5.2)
SODIUM: 140 mmol/L (ref 134–144)
Total Protein: 6.7 g/dL (ref 6.0–8.5)

## 2016-04-02 LAB — TSH: TSH: 1.04 u[IU]/mL (ref 0.450–4.500)

## 2016-04-02 LAB — LIPID PANEL
CHOL/HDL RATIO: 2.7 ratio (ref 0.0–4.4)
CHOLESTEROL TOTAL: 166 mg/dL (ref 100–199)
HDL: 62 mg/dL (ref >39)
LDL CALC: 95 mg/dL (ref 0–99)
Triglycerides: 45 mg/dL (ref 0–149)
VLDL Cholesterol Cal: 9 mg/dL (ref 5–40)

## 2016-04-08 ENCOUNTER — Encounter: Payer: Self-pay | Admitting: Family Medicine

## 2016-04-08 ENCOUNTER — Ambulatory Visit (INDEPENDENT_AMBULATORY_CARE_PROVIDER_SITE_OTHER): Payer: 59 | Admitting: Family Medicine

## 2016-04-08 VITALS — BP 122/76 | HR 83 | Temp 98.2°F | Ht 64.25 in | Wt 176.5 lb

## 2016-04-08 DIAGNOSIS — Z0000 Encounter for general adult medical examination without abnormal findings: Secondary | ICD-10-CM

## 2016-04-08 DIAGNOSIS — F419 Anxiety disorder, unspecified: Secondary | ICD-10-CM | POA: Diagnosis not present

## 2016-04-08 MED ORDER — SERTRALINE HCL 50 MG PO TABS: 50.0000 mg | ORAL_TABLET | Freq: Every day | ORAL | 2 refills | Status: DC

## 2016-04-08 MED ORDER — SERTRALINE HCL 100 MG PO TABS: 100.0000 mg | ORAL_TABLET | Freq: Every day | ORAL | 3 refills | Status: DC

## 2016-04-08 NOTE — Assessment & Plan Note (Signed)
Reviewed preventive care protocols, scheduled due services, and updated immunizations Discussed nutrition, exercise, diet, and healthy lifestyle.  

## 2016-04-08 NOTE — Patient Instructions (Signed)
Great to see you. We are increasing your zoloft to 100 mg daily. Please keep me updated and hang in there.

## 2016-04-08 NOTE — Progress Notes (Signed)
Subjective:    Patient ID: Maria Thornton, female    DOB: 08-23-1971, 45 y.o.   MRN: [id number]  HPI  Pleasant 45 yo G2P2 here for CPX and follow up of chronic medical conditions.   No h/o abnormal pap smears. Last pap smear 02/18/14- done by me.  Mammogram 07/25/15 No family h/o breast, uterine, ovarian or cervical cancer.  Husband had a vasectomy.  Anxiety- had been stable on current dose of Zoloft 50 mg daily until recently. Increased stressors with her children.  She is more depressed and anxious.  Denies SI or HI  Lab Results  Component Value Date   CHOL 166 04/01/2016   HDL 62 04/01/2016   LDLCALC 95 04/01/2016   TRIG 45 04/01/2016   CHOLHDL 2.7 04/01/2016   Lab Results  Component Value Date   CREATININE 0.79 04/01/2016   Lab Results  Component Value Date   NA 140 04/01/2016   K 4.8 04/01/2016   CL 103 04/01/2016   CO2 23 04/01/2016   Lab Results  Component Value Date   ALT 27 04/01/2016   AST 27 04/01/2016   ALKPHOS 60 04/01/2016   BILITOT 0.2 04/01/2016   Lab Results  Component Value Date   WBC 4.7 04/01/2016   HGB 13.2 02/18/2014   HCT 35.7 04/01/2016   MCV 85 04/01/2016   PLT 275 04/01/2016    Patient Active Problem List  Diagnosis  . Anemia  . Allergic rhinitis  . Anxiety  . Routine general medical examination at a health care facility  . Acne   Past Medical History:  Diagnosis Date  . Heart murmur    no proph  . History of asthma    in the past, very mild  . History of Bell's palsy    years ago  . Multiple allergies    Past Surgical History:  Procedure Laterality Date  . EYE SURGERY    ?  . NASAL SEPTUM SURGERY     Social History  Substance Use Topics  . Smoking status: Never Smoker  . Smokeless tobacco: Not on file  . Alcohol use Yes   Family History  Problem Relation Age of Onset  . Arthritis Mother     rheumatoid  . Hypertension Mother   . Hypertension Father   . Diabetes Maternal Grandmother   . Arthritis  Maternal Grandfather     rheumatoid  . Hyperlipidemia Maternal Grandfather   . Hypertension Maternal Grandfather   . Arthritis Paternal Grandmother   . Breast cancer Neg Hx    Allergies  Allergen Reactions  . Sulfonamide Derivatives     REACTION: rash   Current Outpatient Prescriptions on File Prior to Visit  Medication Sig Dispense Refill  . clindamycin-benzoyl peroxide (BENZACLIN) gel Apply 1 application topically daily.     . Doxycycline Hyclate (ACTICLATE) 150 MG TABS Take by mouth daily. TAKE 1/3 TABLET BID    . fluticasone (FLONASE) 50 MCG/ACT nasal spray 2 sprays each nostril once daily 2 sprays each nostril once daily 16 g 3  . Multiple Vitamins-Minerals (CENTRUM ADULTS) TABS Take 1 tablet by mouth daily.    Marland Kitchen. spironolactone (ALDACTONE) 50 MG tablet Take 50 mg by mouth 2 (two) times daily.    . tazarotene (AVAGE) 0.1 % cream Apply topically at bedtime.     No current facility-administered medications on file prior to visit.    The PMH, PSH, Social History, Family History, Medications, and allergies [facility], and have been updated  if relevant.   Review of Systems  Constitutional: Negative.   HENT: Negative.   Respiratory: Negative.   Cardiovascular: Negative.   Gastrointestinal: Negative.   Endocrine: Negative.   Genitourinary: Negative.   Musculoskeletal: Negative.   Skin: Negative.   Allergic/Immunologic: Negative.   Neurological: Negative.   Hematological: Negative.   Psychiatric/Behavioral: Positive for dysphoric mood and sleep disturbance. Negative for self-injury and suicidal ideas. The patient is nervous/anxious. The patient is not hyperactive.   All other systems reviewed and are negative.       Objective:   Physical Exam BP 122/76   Pulse 83   Temp 98.2 F (36.8 C) (Oral)   Ht 5' 4.25" (1.632 m)   Wt 176 lb 8 oz (80.1 kg)   LMP 03/16/2016   SpO2 99%   BMI 30.06 kg/m  Wt Readings from Last 3 Encounters:  04/08/16 176 lb 8 oz  (80.1 kg)  03/17/15 166 lb 8 oz (75.5 kg)  02/18/14 168 lb 12 oz (76.5 kg)     General:  Well-developed,well-nourished,in no acute distress; alert,appropriate and cooperative throughout examination Head:  normocephalic and atraumatic.   Eyes:  vision grossly intact, pupils equal, pupils round, and pupils reactive to light.   Ears:  R ear normal and L ear normal.   Nose:  no external deformity.   Mouth:  good dentition.   Neck:  No deformities, masses, or tenderness noted. Breasts:  No mass, nodules, thickening, tenderness, bulging, retraction, inflamation, nipple discharge or skin changes noted.   Lungs:  Normal respiratory effort, chest expands symmetrically. Lungs are clear to auscultation, no crackles or wheezes. Heart:  Normal rate and regular rhythm. S1 and S2 normal without gallop, murmur, click, rub or other extra sounds. Abdomen:  Bowel sounds positive,abdomen soft and non-tender without masses, organomegaly or hernias noted. Msk:  No deformity or scoliosis noted of thoracic or lumbar spine.   Extremities:  No clubbing, cyanosis, edema, or deformity noted with normal full range of motion of all joints.   Neurologic:  alert & oriented X3 and gait normal.   Skin:  Intact without suspicious lesions or rashes Cervical Nodes:  No lymphadenopathy noted Axillary Nodes:  No palpable lymphadenopathy Psych:  Cognition and judgment appear intact. Alert and cooperative with normal attention span and concentration. No apparent delusions, illusions, hallucinations     Assessment & Plan:

## 2016-04-08 NOTE — Progress Notes (Signed)
Pre visit review using our clinic review tool, if applicable. No additional management support is needed unless otherwise documented below in the visit note. 

## 2016-04-08 NOTE — Assessment & Plan Note (Signed)
Deteriorated due to acute stressors. Declines psychotherapy. Increase zoloft to 100 mg daily Call or return to clinic prn if these symptoms worsen or fail to improve as anticipated. The patient indicates understanding of these issues and agrees with the plan.

## 2016-04-13 ENCOUNTER — Encounter: Payer: Self-pay | Admitting: Family Medicine

## 2016-04-14 ENCOUNTER — Other Ambulatory Visit: Payer: Self-pay | Admitting: Family Medicine

## 2016-04-14 DIAGNOSIS — Z0000 Encounter for general adult medical examination without abnormal findings: Secondary | ICD-10-CM

## 2016-04-15 ENCOUNTER — Other Ambulatory Visit (INDEPENDENT_AMBULATORY_CARE_PROVIDER_SITE_OTHER): Payer: 59

## 2016-04-15 DIAGNOSIS — Z0000 Encounter for general adult medical examination without abnormal findings: Secondary | ICD-10-CM

## 2016-04-16 LAB — HEMOGLOBIN A1C
ESTIMATED AVERAGE GLUCOSE: 117 mg/dL
Hgb A1c MFr Bld: 5.7 % — ABNORMAL HIGH (ref 4.8–5.6)

## 2016-05-03 ENCOUNTER — Telehealth: Payer: Self-pay | Admitting: Family Medicine

## 2016-05-03 NOTE — Telephone Encounter (Signed)
Spoke to pt and informed her form is available for pickup from the front desk 

## 2016-05-03 NOTE — Telephone Encounter (Signed)
Patient called and said she faxed over a form on 04/29/16. The form was [employer] screening appeal form.  Patient is asking to be called when form is completed.  Patient will come by to pick up form.

## 2016-05-03 NOTE — Telephone Encounter (Signed)
I completed this form this morning

## 2016-07-12 ENCOUNTER — Other Ambulatory Visit: Payer: Self-pay | Admitting: Family Medicine

## 2016-07-12 DIAGNOSIS — Z1231 Encounter for screening mammogram for malignant neoplasm of breast: Secondary | ICD-10-CM

## 2016-07-22 ENCOUNTER — Ambulatory Visit
Admission: RE | Admit: 2016-07-22 | Discharge: 2016-07-22 | Disposition: A | Discharge status: Home / Self Care | Payer: 59 | Source: Ambulatory Visit | Attending: Family Medicine | Admitting: Family Medicine

## 2016-07-22 DIAGNOSIS — Z1231 Encounter for screening mammogram for malignant neoplasm of breast: Secondary | ICD-10-CM | POA: Diagnosis present

## 2017-02-24 ENCOUNTER — Telehealth: Payer: Self-pay | Admitting: Family Medicine

## 2017-02-24 DIAGNOSIS — Z01419 Encounter for gynecological examination (general) (routine) without abnormal findings: Secondary | ICD-10-CM

## 2017-02-24 NOTE — Telephone Encounter (Signed)
Pt dropped off biometric screening form. She has lab appt on 08/15. She needs to have total chol, ldl chol. hdl chol, triglycerides, Hba1c, and glucose drawn.   Placing form in rx tower.   cb number for pt is [phone] Thanks

## 2017-02-24 NOTE — Telephone Encounter (Signed)
Orders entered

## 2017-03-31 ENCOUNTER — Other Ambulatory Visit: Payer: Self-pay | Admitting: Family Medicine

## 2017-03-31 DIAGNOSIS — D509 Iron deficiency anemia, unspecified: Secondary | ICD-10-CM

## 2017-03-31 DIAGNOSIS — Z0000 Encounter for general adult medical examination without abnormal findings: Secondary | ICD-10-CM

## 2017-04-04 ENCOUNTER — Encounter: Payer: Self-pay | Admitting: Family Medicine

## 2017-04-06 ENCOUNTER — Other Ambulatory Visit (INDEPENDENT_AMBULATORY_CARE_PROVIDER_SITE_OTHER): Payer: 59

## 2017-04-06 DIAGNOSIS — Z0000 Encounter for general adult medical examination without abnormal findings: Secondary | ICD-10-CM

## 2017-04-06 DIAGNOSIS — Z01419 Encounter for gynecological examination (general) (routine) without abnormal findings: Secondary | ICD-10-CM | POA: Diagnosis not present

## 2017-04-06 NOTE — Addendum Note (Signed)
Addended by: Leita Lindbloom J on: 04/06/2017 07:42 AM   Modules accepted: Orders  

## 2017-04-06 NOTE — Addendum Note (Signed)
Addended by: WALSH, TERRI J on: 04/06/2017 07:42 AM   Modules accepted: Orders  

## 2017-04-06 NOTE — Addendum Note (Signed)
Addended by: Alvina ChouWALSH, Alizon Schmeling J on: 04/06/2017 07:42 AM   Modules accepted: Orders

## 2017-04-07 LAB — LIPID PANEL
CHOLESTEROL TOTAL: 149 mg/dL (ref 100–199)
Chol/HDL Ratio: 2.9 ratio (ref 0.0–4.4)
HDL: 51 mg/dL (ref >39)
LDL Calculated: 85 mg/dL (ref 0–99)
TRIGLYCERIDES: 63 mg/dL (ref 0–149)
VLDL CHOLESTEROL CAL: 13 mg/dL (ref 5–40)

## 2017-04-07 LAB — HEMOGLOBIN A1C
Est. average glucose Bld gHb Est-mCnc: 111 mg/dL
HEMOGLOBIN A1C: 5.5 % (ref 4.8–5.6)

## 2017-04-07 LAB — COMPREHENSIVE METABOLIC PANEL
ALK PHOS: 55 IU/L (ref 39–117)
ALT: 26 IU/L (ref 0–32)
AST: 25 IU/L (ref 0–40)
Albumin/Globulin Ratio: 1.8 (ref 1.2–2.2)
Albumin: 4.3 g/dL (ref 3.5–5.5)
BUN/Creatinine Ratio: 19 (ref 9–23)
BUN: 16 mg/dL (ref 6–24)
Bilirubin Total: 0.4 mg/dL (ref 0.0–1.2)
CO2: 23 mmol/L (ref 20–29)
CREATININE: 0.85 mg/dL (ref 0.57–1.00)
Calcium: 9.7 mg/dL (ref 8.7–10.2)
Chloride: 102 mmol/L (ref 96–106)
GFR calc Af Amer: 95 mL/min/{1.73_m2} (ref >59)
GFR calc non Af Amer: 82 mL/min/{1.73_m2} (ref >59)
GLUCOSE: 92 mg/dL (ref 65–99)
Globulin, Total: 2.4 g/dL (ref 1.5–4.5)
Potassium: 4.8 mmol/L (ref 3.5–5.2)
SODIUM: 138 mmol/L (ref 134–144)
Total Protein: 6.7 g/dL (ref 6.0–8.5)

## 2017-04-07 LAB — TSH: TSH: 1.46 u[IU]/mL (ref 0.450–4.500)

## 2017-04-11 ENCOUNTER — Other Ambulatory Visit: Payer: Self-pay | Admitting: Family Medicine

## 2017-04-11 ENCOUNTER — Encounter: Payer: Self-pay | Admitting: Family Medicine

## 2017-04-11 ENCOUNTER — Ambulatory Visit (INDEPENDENT_AMBULATORY_CARE_PROVIDER_SITE_OTHER): Payer: 59 | Admitting: Family Medicine

## 2017-04-11 ENCOUNTER — Other Ambulatory Visit (HOSPITAL_COMMUNITY)
Admission: RE | Admit: 2017-04-11 | Discharge: 2017-04-11 | Disposition: A | Discharge status: Home / Self Care | Payer: 59 | Source: Ambulatory Visit | Attending: Family Medicine | Admitting: Family Medicine

## 2017-04-11 VITALS — BP 122/78 | HR 80 | Temp 98.1°F | Resp 16 | Ht 64.0 in | Wt 168.8 lb

## 2017-04-11 DIAGNOSIS — F419 Anxiety disorder, unspecified: Secondary | ICD-10-CM

## 2017-04-11 DIAGNOSIS — Z0000 Encounter for general adult medical examination without abnormal findings: Secondary | ICD-10-CM | POA: Diagnosis present

## 2017-04-11 DIAGNOSIS — Z0001 Encounter for general adult medical examination with abnormal findings: Secondary | ICD-10-CM

## 2017-04-11 DIAGNOSIS — Z124 Encounter for screening for malignant neoplasm of cervix: Secondary | ICD-10-CM

## 2017-04-11 MED ORDER — BUSPIRONE HCL 30 MG PO TABS: 15.0000 mg | ORAL_TABLET | Freq: Two times a day (BID) | ORAL | 3 refills | Status: DC

## 2017-04-11 MED ORDER — VENLAFAXINE HCL ER 37.5 MG PO CP24: 37.5000 mg | ORAL_CAPSULE | Freq: Every day | ORAL | 3 refills | Status: DC

## 2017-04-11 NOTE — Addendum Note (Signed)
Addended by: Osa Craver on: 04/11/2017 08:50 AM   Modules accepted: Orders

## 2017-04-11 NOTE — Progress Notes (Signed)
Subjective:   Patient ID: Maria Thornton, female    DOB: Feb 06, 1971, 46 y.o.   MRN: [id number]  Maria Thornton is a pleasant 46 y.o. year old female who presents to clinic today with Annual Exam  on 04/11/2017  HPI:  G2P2 No h/o abnormal pap smears. Last pap smear 02/18/14- done by me.  Mammogram 07/22/16 No family h/o breast, uterine, ovarian or cervical cancer.  Husband had a vasectomy.  Anxiety- when I last saw her a year ago, anxiety had deteriorated-- increased stressors. We increased her zoloft to 100 mg daily. She could not tolerate higher dose- decreased back down to 50 mg daily due to weight gain. Now also on weight watchers. Admits to feeling more anxious, a little tearful.  Not depressed. Lab Results  Component Value Date   CHOL 149 04/06/2017   HDL 51 04/06/2017   LDLCALC 85 04/06/2017   TRIG 63 04/06/2017   CHOLHDL 2.9 04/06/2017   Lab Results  Component Value Date   CREATININE 0.85 04/06/2017   Lab Results  Component Value Date   WBC 4.7 04/01/2016   HGB 11.3 04/01/2016   HCT 35.7 04/01/2016   MCV 85 04/01/2016   PLT 275 04/01/2016   Lab Results  Component Value Date   TSH 1.460 04/06/2017   Lab Results  Component Value Date   NA 138 04/06/2017   K 4.8 04/06/2017   CL 102 04/06/2017   CO2 23 04/06/2017   Lab Results  Component Value Date   ALT 26 04/06/2017   AST 25 04/06/2017   ALKPHOS 55 04/06/2017   BILITOT 0.4 04/06/2017   Lab Results  Component Value Date   WBC 4.7 04/01/2016   HGB 11.3 04/01/2016   HCT 35.7 04/01/2016   MCV 85 04/01/2016   PLT 275 04/01/2016    Current Outpatient Prescriptions on File Prior to Visit  Medication Sig Dispense Refill  . clindamycin-benzoyl peroxide (BENZACLIN) gel Apply 1 application topically daily.     . Doxycycline Hyclate (ACTICLATE) 150 MG TABS Take by mouth daily. TAKE 1/3 TABLET BID    . fluticasone (FLONASE) 50 MCG/ACT nasal spray 2 sprays each nostril once daily 2 sprays each  nostril once daily 16 g 3  . Multiple Vitamins-Minerals (CENTRUM ADULTS) TABS Take 1 tablet by mouth daily.    Marland Kitchen spironolactone (ALDACTONE) 50 MG tablet Take 50 mg by mouth 2 (two) times daily.    . tazarotene (AVAGE) 0.1 % cream Apply topically at bedtime.     No current facility-administered medications on file prior to visit.     Allergies  Allergen Reactions  . Sulfonamide Derivatives     REACTION: rash    Past Medical History:  Diagnosis Date  . Heart murmur    no proph  . History of asthma    in the past, very mild  . History of Bell's palsy    years ago  . Multiple allergies     Past Surgical History:  Procedure Laterality Date  . EYE SURGERY    ?  . NASAL SEPTUM SURGERY      Family History  Problem Relation Age of Onset  . Arthritis Mother        rheumatoid  . Hypertension Mother   . Hypertension Father   . Diabetes Maternal Grandmother   . Arthritis Maternal Grandfather        rheumatoid  . Hyperlipidemia Maternal Grandfather   . Hypertension Maternal Grandfather   . Arthritis Paternal Grandmother   .  Breast cancer Neg Hx     Social History   Social History  . Marital status: Married    Spouse name: N/A  . Number of children: 2  . Years of education: N/A   Occupational History  . Licensed conveyancer [employer]   Social History Main Topics  . Smoking status: Never Smoker  . Smokeless tobacco: Never Used  . Alcohol use Yes  . Drug use: No  . Sexual activity: Not on file   Other Topics Concern  . Not on file   Social History Narrative   No regular exercise.   The PMH, PSH, Social History, Family History, Medications, and allergies have been reviewed in [facility], and have been updated if relevant.    Review of Systems  Constitutional: Negative.   HENT: Negative.   Eyes: Negative.   Respiratory: Negative.   Cardiovascular: Negative.   Gastrointestinal: Negative.   Endocrine: Negative.   Genitourinary: Negative.   Musculoskeletal: Negative.   Skin:  Negative.   Allergic/Immunologic: Negative.   Neurological: Negative.   Hematological: Negative.   Psychiatric/Behavioral: The patient is nervous/anxious.   All other systems reviewed and are negative.      Objective:    BP 122/78   Pulse 80   Temp 98.1 F (36.7 C) (Oral)   Resp 16   Ht 5\' 4"  (1.626 m)   Wt 168 lb 12.8 oz (76.6 kg)   SpO2 98%   BMI 28.97 kg/m    Physical Exam    General:  Well-developed,well-nourished,in no acute distress; alert,appropriate and cooperative throughout examination Head:  normocephalic and atraumatic.   Eyes:  vision grossly intact, PERRL Ears:  R ear normal and L ear normal externally, TMs clear bilaterally Nose:  no external deformity.   Mouth:  good dentition.   Neck:  No deformities, masses, or tenderness noted. Breasts:  No mass, nodules, thickening, tenderness, bulging, retraction, inflamation, nipple discharge or skin changes noted.   Lungs:  Normal respiratory effort, chest expands symmetrically. Lungs are clear to auscultation, no crackles or wheezes. Heart:  Normal rate and regular rhythm. S1 and S2 normal without gallop, murmur, click, rub or other extra sounds. Abdomen:  Bowel sounds positive,abdomen soft and non-tender without masses, organomegaly or hernias noted. Rectal:  no external abnormalities.   Genitalia:  Pelvic Exam:        External: normal female genitalia without lesions or masses        Vagina: normal without lesions or masses        Cervix: normal without lesions or masses        Adnexa: normal bimanual exam without masses or fullness        Uterus: normal by palpation        Pap smear: performed Msk:  No deformity or scoliosis noted of thoracic or lumbar spine.   Extremities:  No clubbing, cyanosis, edema, or deformity noted with normal full range of motion of all joints.   Neurologic:  alert & oriented X3 and gait normal.   Skin:  Intact without suspicious lesions or rashes Cervical Nodes:  No  lymphadenopathy noted Axillary Nodes:  No palpable lymphadenopathy Psych:  Cognition and judgment appear intact. Alert and cooperative with normal attention span and concentration. No apparent delusions, illusions, hallucinations      Assessment & Plan:   Routine general medical examination at a health care facility  Anxiety No Follow-up on file.

## 2017-04-11 NOTE — Assessment & Plan Note (Signed)
Deteriorated. Discussed tx options.  She is still continuing to decline psychotherapy. Add Buspar 15 mg twice daily to current dose of zoloft. Call or return to clinic prn if these symptoms worsen or fail to improve as anticipated. The patient indicates understanding of these issues and agrees with the plan.

## 2017-04-11 NOTE — Patient Instructions (Signed)
Great to see you. We are adding buspar 15 mg twice daily to your current dose of zoloft. Please keep me updated.

## 2017-04-11 NOTE — Assessment & Plan Note (Signed)
Reviewed preventive care protocols, scheduled due services, and updated immunizations Discussed nutrition, exercise, diet, and healthy lifestyle.  Pap smear done today. 

## 2017-04-12 LAB — CYTOLOGY - PAP
DIAGNOSIS: NEGATIVE
HPV (WINDOPATH): DETECTED — AB
HPV 16/18/45 GENOTYPING: NEGATIVE

## 2017-07-08 ENCOUNTER — Other Ambulatory Visit: Payer: Self-pay | Admitting: Family Medicine

## 2017-07-08 DIAGNOSIS — Z1231 Encounter for screening mammogram for malignant neoplasm of breast: Secondary | ICD-10-CM

## 2017-07-09 ENCOUNTER — Encounter: Payer: Self-pay | Admitting: Family Medicine

## 2017-07-11 ENCOUNTER — Other Ambulatory Visit: Payer: Self-pay | Admitting: Family Medicine

## 2017-07-11 MED ORDER — VENLAFAXINE HCL ER 75 MG PO CP24: 75.0000 mg | ORAL_CAPSULE | Freq: Every day | ORAL | 3 refills | Status: DC

## 2017-07-29 ENCOUNTER — Ambulatory Visit
Admission: RE | Admit: 2017-07-29 | Discharge: 2017-07-29 | Disposition: A | Discharge status: Home / Self Care | Payer: 59 | Source: Ambulatory Visit | Attending: Family Medicine | Admitting: Family Medicine

## 2017-07-29 DIAGNOSIS — Z1231 Encounter for screening mammogram for malignant neoplasm of breast: Secondary | ICD-10-CM | POA: Diagnosis not present

## 2017-08-02 ENCOUNTER — Other Ambulatory Visit: Payer: Self-pay | Admitting: Family Medicine

## 2017-08-02 DIAGNOSIS — N6320 Unspecified lump in the left breast, unspecified quadrant: Secondary | ICD-10-CM

## 2017-08-02 DIAGNOSIS — R928 Other abnormal and inconclusive findings on diagnostic imaging of breast: Secondary | ICD-10-CM

## 2017-08-03 ENCOUNTER — Encounter: Payer: Self-pay | Admitting: Family Medicine

## 2017-08-11 ENCOUNTER — Ambulatory Visit
Admission: RE | Admit: 2017-08-11 | Discharge: 2017-08-11 | Disposition: A | Discharge status: Home / Self Care | Payer: 59 | Source: Ambulatory Visit | Attending: Family Medicine | Admitting: Family Medicine

## 2017-08-11 DIAGNOSIS — R928 Other abnormal and inconclusive findings on diagnostic imaging of breast: Secondary | ICD-10-CM

## 2017-08-11 DIAGNOSIS — N6320 Unspecified lump in the left breast, unspecified quadrant: Secondary | ICD-10-CM | POA: Diagnosis present

## 2017-08-11 DIAGNOSIS — N6002 Solitary cyst of left breast: Secondary | ICD-10-CM | POA: Insufficient documentation

## 2017-10-31 ENCOUNTER — Other Ambulatory Visit: Payer: Self-pay | Admitting: Family Medicine

## 2017-11-04 ENCOUNTER — Encounter: Payer: Self-pay | Admitting: Family Medicine

## 2017-11-08 ENCOUNTER — Telehealth: Payer: Self-pay

## 2017-11-08 NOTE — Telephone Encounter (Signed)
I sent pt a MyChart message stating to call and sched appt for derm referral with Dr. Aron/thx dmf

## 2017-12-26 ENCOUNTER — Encounter: Payer: Self-pay | Admitting: Family Medicine

## 2018-01-23 ENCOUNTER — Ambulatory Visit: Payer: Managed Care, Other (non HMO) | Admitting: Family Medicine

## 2018-01-23 ENCOUNTER — Encounter: Payer: Self-pay | Admitting: Family Medicine

## 2018-01-23 VITALS — BP 126/84 | HR 81 | Temp 98.8°F | Ht 64.0 in | Wt 183.8 lb

## 2018-01-23 DIAGNOSIS — L708 Other acne: Secondary | ICD-10-CM | POA: Diagnosis not present

## 2018-01-23 DIAGNOSIS — G4700 Insomnia, unspecified: Secondary | ICD-10-CM | POA: Diagnosis not present

## 2018-01-23 DIAGNOSIS — L709 Acne, unspecified: Secondary | ICD-10-CM | POA: Insufficient documentation

## 2018-01-23 MED ORDER — TRAZODONE HCL 50 MG PO TABS: 25.0000 mg | ORAL_TABLET | Freq: Every evening | ORAL | 3 refills | Status: DC | PRN

## 2018-01-23 NOTE — Assessment & Plan Note (Signed)
Persistent.  Refer to derm. The patient indicates understanding of these issues and agrees with the plan.

## 2018-01-23 NOTE — Progress Notes (Signed)
Subjective:   Patient ID: Maria GandyJennifer W Birch, female    DOB: 1970-11-25, 47 y.o.   MRN: [id number]  Maria GandyJennifer W Millett is a pleasant 47 y.o. year old female who presents to clinic today with Insomnia (She is here today C/O sleep issues x2 months.  No problem shutting things off from the day but trouble getting to sleep and staying asleep.  If she adds up the interrupted sleep she may get approx 6 hours total.  She has tried Chamom[place] tea and exercise.  Cannot take Melatonin with anxiety med as far as her research.) and Acne (She would also like to discuss Acne.  She see Dr. Park LiterBenitz-Graham [place]  She currently takes Benzaclin Gel once daily and Doxy 150mg  1qd and Retin-A Micro Pump.  She basically would like to get a referral to a different dermatologist and has been on an abx for several years for her cystic acne.  )  on 01/23/2018  HPI:  Insomnia- ongoing for 2 months.  Trouble turning off her mind- falling asleep and staying asleep. She has tried Chamomile tea and exercise which have not helped much. She cannot take Melatonin with her medication from what she has researched. Denies any symptoms of anxiety or depression.   Acne- sees dermatology for this- Dr. Norwood LevoBenitz- Cheree [place]Graham [place].  Currently taking Benzaclin Gel daily and Doxy 150 mg daily.  She would like to get a referral to a different dermatologist as she feels this is no longer working.  Has even taken accutane in her 4920s and 30s.   Current Outpatient Medications on File Prior to Visit  Medication Sig Dispense Refill  . clindamycin-benzoyl peroxide (BENZACLIN) gel Apply 1 application topically daily.     . Doxycycline Hyclate (ACTICLATE) 150 MG TABS Take by mouth daily. TAKE 1/3 TABLET BID    . fluticasone (FLONASE) 50 MCG/ACT nasal spray 2 sprays each nostril once daily 2 sprays each nostril once daily 16 g 3  . RETIN-A MICRO PUMP 0.08 % GEL APPLY ONE APPLICATION TOPICALLY EVERY NIGHT AT BEDTIME  1  .  spironolactone (ALDACTONE) 100 MG tablet Take 100 mg by mouth 2 (two) times daily.     Marland Kitchen. venlafaxine XR (EFFEXOR-XR) 75 MG 24 hr capsule TAKE ONE CAPSULE BY MOUTH DAILY WITH BREAKFAST 30 capsule 2   No current facility-administered medications on file prior to visit.     Allergies  Allergen Reactions  . Sulfonamide Derivatives     REACTION: rash    Past Medical History:  Diagnosis Date  . Heart murmur    no proph  . History of asthma    in the past, very mild  . History of Bell's palsy    years ago  . Multiple allergies     Past Surgical History:  Procedure Laterality Date  . EYE SURGERY    ?  . NASAL SEPTUM SURGERY      Family History  Problem Relation Age of Onset  . Arthritis Mother        rheumatoid  . Hypertension Mother   . Hypertension Father   . Diabetes Maternal Grandmother   . Arthritis Maternal Grandfather        rheumatoid  . Hyperlipidemia Maternal Grandfather   . Hypertension Maternal Grandfather   . Arthritis Paternal Grandmother   . Breast cancer Neg Hx     Social History   Socioeconomic History  . Marital status: Married    Spouse name: Not on file  .  Number of children: 2  . Years of education: Not on file  . Highest education level: Not on file  Occupational History  . Occupation: Event organiser: [employer]  Social Needs  . Financial resource strain: Not on file  . Food insecurity:    Worry: Not on file    Inability: Not on file  . Transportation needs:    Medical: Not on file    Non-medical: Not on file  Tobacco Use  . Smoking status: Never Smoker  . Smokeless tobacco: Never Used  Substance and Sexual Activity  . Alcohol use: Yes  . Drug use: No  . Sexual activity: Not on file  Lifestyle  . Physical activity:    Days per week: Not on file    Minutes per session: Not on file  . Stress: Not on file  Relationships  . Social connections:    Talks on phone: Not on file    Gets together: Not on file    Attends religious  service: Not on file    Active member of club or organization: Not on file    Attends meetings of clubs or organizations: Not on file    Relationship status: Not on file  . Intimate partner violence:    Fear of current or ex partner: Not on file    Emotionally abused: Not on file    Physically abused: Not on file    Forced sexual activity: Not on file  Other Topics Concern  . Not on file  Social History Narrative   No regular exercise.   The PMH, PSH, Social History, Family History, Medications, and allergies have been reviewed in [facility], and have been updated if relevant.   Review of Systems  Constitutional: Negative.   HENT: Negative.   Respiratory: Negative.   Cardiovascular: Negative.   Gastrointestinal: Negative.   Endocrine: Negative.   Genitourinary: Negative.   Musculoskeletal: Negative.   Skin:       + acne  Allergic/Immunologic: Negative.   Neurological: Negative.   Psychiatric/Behavioral: Positive for sleep disturbance. Negative for agitation, behavioral problems, confusion, decreased concentration, dysphoric mood, hallucinations, self-injury and suicidal ideas. The patient is not nervous/anxious and is not hyperactive.   All other systems reviewed and are negative.      Objective:    BP 126/84 (BP Location: Left Arm, Patient Position: Sitting, Cuff Size: Normal)   Pulse 81   Temp 98.8 F (37.1 C) (Oral)   Ht 5\' 4"  (1.626 m)   Wt 183 lb 12.8 oz (83.4 kg)   LMP 01/17/2018 Comment: Spotting for 2 days only  SpO2 99%   BMI 31.55 kg/m    Physical Exam  Constitutional: She is oriented to person, place, and time. She appears well-developed and well-nourished. No distress.  HENT:  Head: Normocephalic and atraumatic.  Eyes: EOM are normal.  Neck: Normal range of motion.  Cardiovascular: Normal rate.  Pulmonary/Chest: Effort normal.  Musculoskeletal: Normal range of motion.  Neurological: She is alert and oriented to person, place, and time. No cranial nerve  deficit.  Skin: Skin is warm and dry. She is not diaphoretic.  + cystic acne  Psychiatric: She has a normal mood and affect. Her behavior is normal. Judgment and thought content normal.  Nursing note and vitals reviewed.         Assessment & Plan:   Insomnia, unspecified type  Other acne - Plan: Ambulatory referral to Dermatology No follow-ups on file.

## 2018-01-23 NOTE — Patient Instructions (Addendum)
Great to see you. We are referring you to another dermatologist. We will call you with an appointment.  We are starting trazodone 1/2 - 1 full tablet nightly.  Please keep me updated.

## 2018-01-23 NOTE — Assessment & Plan Note (Signed)
>  25 min spent with patient, at least half of which was spent on counseling insomnia.  The problem of recurrent insomnia is discussed. Avoidance of caffeine sources is strongly encouraged. Sleep hygiene issues are reviewed. eRx sent for trazodone- she will update me with her symptoms.

## 2018-01-30 ENCOUNTER — Other Ambulatory Visit: Payer: Self-pay | Admitting: Family Medicine

## 2018-03-29 ENCOUNTER — Ambulatory Visit (INDEPENDENT_AMBULATORY_CARE_PROVIDER_SITE_OTHER): Payer: Managed Care, Other (non HMO) | Admitting: Family Medicine

## 2018-03-29 ENCOUNTER — Encounter: Payer: Self-pay | Admitting: Family Medicine

## 2018-03-29 VITALS — BP 116/72 | HR 80 | Temp 98.1°F | Ht 64.0 in | Wt 182.4 lb

## 2018-03-29 DIAGNOSIS — Z01419 Encounter for gynecological examination (general) (routine) without abnormal findings: Secondary | ICD-10-CM | POA: Diagnosis not present

## 2018-03-29 DIAGNOSIS — G4700 Insomnia, unspecified: Secondary | ICD-10-CM | POA: Diagnosis not present

## 2018-03-29 DIAGNOSIS — Z0000 Encounter for general adult medical examination without abnormal findings: Secondary | ICD-10-CM | POA: Diagnosis not present

## 2018-03-29 DIAGNOSIS — F419 Anxiety disorder, unspecified: Secondary | ICD-10-CM

## 2018-03-29 DIAGNOSIS — D509 Iron deficiency anemia, unspecified: Secondary | ICD-10-CM

## 2018-03-29 NOTE — Progress Notes (Signed)
Subjective:   Patient ID: Maria Thornton, female    DOB: 08/19/1971, 47 y.o.   MRN: [id number]  Maria Thornton is a pleasant 47 y.o. year old female who presents to clinic today with Annual Exam (Patient is here today for a CPE with PAP.  Last Mammogram at [facility] on 12.7.18.  Immunizations are UTD.  She had yogurt at 8am, all labs must go to [place]. )  on 03/29/2018  HPI:  Health Maintenance  Topic Date Due  . HIV Screening  01/08/1986  . INFLUENZA VACCINE  03/23/2018  . MAMMOGRAM  07/29/2018  . PAP SMEAR  04/11/2020  . 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 Mammogram 07/29/17. No family h/o breast, uterine, ovarian or cervical cancer.  Husband had a vasectomy.  Anxiety- currently taking Effexor 75 mg daily, trazodone at bedtime.  And this regimen is working much better.   Current Outpatient Medications on File Prior to Visit  Medication Sig Dispense Refill  . clindamycin-benzoyl peroxide (BENZACLIN) gel Apply 1 application topically daily.     . Doxycycline Hyclate (ACTICLATE) 150 MG TABS Take by mouth daily. TAKE 1/3 TABLET BID    . fluticasone (FLONASE) 50 MCG/ACT nasal spray 2 sprays each nostril once daily 2 sprays each nostril once daily 16 g 3  . RETIN-A MICRO PUMP 0.08 % GEL APPLY ONE APPLICATION TOPICALLY EVERY NIGHT AT BEDTIME  1  . spironolactone (ALDACTONE) 100 MG tablet Take 100 mg by mouth 2 (two) times daily.     . traZODone (DESYREL) 50 MG tablet Take 0.5-1 tablets (25-50 mg total) by mouth at bedtime as needed for sleep. 30 tablet 3  . venlafaxine XR (EFFEXOR-XR) 75 MG 24 hr capsule TAKE ONE CAPSULE BY MOUTH DAILY WITH BREAKFAST 30 capsule 3   No current facility-administered medications on file prior to visit.     Allergies  Allergen Reactions  . Sulfonamide Derivatives     REACTION: rash    Past Medical History:  Diagnosis Date  . Heart murmur    no proph  . History of  asthma    in the past, very mild  . History of Bell's palsy    years ago  . Multiple allergies     Past Surgical History:  Procedure Laterality Date  . EYE SURGERY    ?  . NASAL SEPTUM SURGERY      Family History  Problem Relation Age of Onset  . Arthritis Mother        rheumatoid  . Hypertension Mother   . Hypertension Father   . Diabetes Maternal Grandmother   . Arthritis Maternal Grandfather        rheumatoid  . Hyperlipidemia Maternal Grandfather   . Hypertension Maternal Grandfather   . Arthritis Paternal Grandmother   . Breast cancer Neg Hx     Social History   Socioeconomic History  . Marital status: Married    Spouse name: Not on file  . Number of children: 2  . Years of education: Not on file  . Highest education level: Not on file  Occupational History  . Occupation: Best boy: [employer]  Social Needs  . Financial resource strain: Not on file  . Food insecurity:    Worry: Not on file    Inability: Not on file  . Transportation needs:    Medical: Not on file    Non-medical: Not on  file  Tobacco Use  . Smoking status: Never Smoker  . Smokeless tobacco: Never Used  Substance and Sexual Activity  . Alcohol use: Yes  . Drug use: No  . Sexual activity: Not on file  Lifestyle  . Physical activity:    Days per week: Not on file    Minutes per session: Not on file  . Stress: Not on file  Relationships  . Social connections:    Talks on phone: Not on file    Gets together: Not on file    Attends religious service: Not on file    Active member of club or organization: Not on file    Attends meetings of clubs or organizations: Not on file    Relationship status: Not on file  . Intimate partner violence:    Fear of current or ex partner: Not on file    Emotionally abused: Not on file    Physically abused: Not on file    Forced sexual activity: Not on file  Other Topics Concern  . Not on file  Social History Narrative   No regular  exercise.   The PMH, PSH, Social History, Family History, Medications, and allergies have been reviewed in [facility], and have been updated if relevant.   Review of Systems  Constitutional: Negative.   HENT: Negative.   Eyes: Negative.   Respiratory: Negative.   Cardiovascular: Negative.   Gastrointestinal: Negative.   Endocrine: Negative.   Genitourinary: Negative.   Musculoskeletal: Negative.   Skin: Negative.   Allergic/Immunologic: Negative.   Neurological: Negative.   Hematological: Negative.   Psychiatric/Behavioral: Negative.   All other systems reviewed and are negative.      Objective:    BP 116/72 (BP Location: Left Arm, Patient Position: Sitting, Cuff Size: Normal)   Pulse 80   Temp 98.1 F (36.7 C) (Oral)   Ht 5' 4" (1.626 m)   Wt 182 lb 6.4 oz (82.7 kg)   LMP 03/20/2018   SpO2 98%   BMI 31.31 kg/m    Physical Exam    General:  Well-developed,well-nourished,in no acute distress; alert,appropriate and cooperative throughout examination Head:  normocephalic and atraumatic.   Eyes:  vision grossly intact, PERRL Ears:  R ear normal and L ear normal externally, TMs clear bilaterally Nose:  no external deformity.   Mouth:  good dentition.   Neck:  No deformities, masses, or tenderness noted. Breasts:  No mass, nodules, thickening, tenderness, bulging, retraction, inflamation, nipple discharge or skin changes noted.   Lungs:  Normal respiratory effort, chest expands symmetrically. Lungs are clear to auscultation, no crackles or wheezes. Heart:  Normal rate and regular rhythm. S1 and S2 normal without gallop, murmur, click, rub or other extra sounds. Abdomen:  Bowel sounds positive,abdomen soft and non-tender without masses, organomegaly or hernias noted. Rectal:  no external abnormalities.   Genitalia:  Pelvic Exam:        External: normal female genitalia without lesions or masses        Vagina: normal without lesions or masses        Cervix: normal without  lesions or masses        Adnexa: normal bimanual exam without masses or fullness        Uterus: normal by palpation        Pap smear: performed Msk:  No deformity or scoliosis noted of thoracic or lumbar spine.   Extremities:  No clubbing, cyanosis, edema, or deformity noted with normal full range of motion   of all joints.   Neurologic:  alert & oriented X3 and gait normal.   Skin:  Intact without suspicious lesions or rashes Cervical Nodes:  No lymphadenopathy noted Axillary Nodes:  No palpable lymphadenopathy Psych:  Cognition and judgment appear intact. Alert and cooperative with normal attention span and concentration. No apparent delusions, illusions, hallucinations      Assessment & Plan:   Well woman exam with routine gynecological exam - Plan: Pap Lb, rfx HPV ASCU, Lipid Profile, Comp Met (CMET), HgB A1c, Measles/Mumps/Rubella Immunity  Iron deficiency anemia, unspecified iron deficiency anemia type  Insomnia, unspecified type  Anxiety No follow-ups on file.

## 2018-03-29 NOTE — Assessment & Plan Note (Signed)
Improved with effexor. No changes made today.

## 2018-03-29 NOTE — Assessment & Plan Note (Signed)
Improved with trazodone. No changes made today. 

## 2018-03-29 NOTE — Patient Instructions (Signed)
Great to see you. I will call you with your lab results from today and you can view them online.   

## 2018-03-29 NOTE — Assessment & Plan Note (Signed)
Reviewed preventive care protocols, scheduled due services, and updated immunizations Discussed nutrition, exercise, diet, and healthy lifestyle.  Pap smear repeated today due to pos HPV last year. Orders Placed This Encounter  Procedures  . Lipid Profile  . Comp Met (CMET)  . HgB A1c  . Measles/Mumps/Rubella Immunity    

## 2018-03-30 ENCOUNTER — Encounter: Payer: Self-pay | Admitting: Family Medicine

## 2018-03-30 LAB — HEMOGLOBIN A1C
ESTIMATED AVERAGE GLUCOSE: 117 mg/dL
HEMOGLOBIN A1C: 5.7 % — AB (ref 4.8–5.6)

## 2018-03-30 LAB — COMPREHENSIVE METABOLIC PANEL
ALK PHOS: 64 IU/L (ref 39–117)
ALT: 25 IU/L (ref 0–32)
AST: 22 IU/L (ref 0–40)
Albumin/Globulin Ratio: 1.6 (ref 1.2–2.2)
Albumin: 4.4 g/dL (ref 3.5–5.5)
BILIRUBIN TOTAL: 0.3 mg/dL (ref 0.0–1.2)
BUN/Creatinine Ratio: 18 (ref 9–23)
BUN: 14 mg/dL (ref 6–24)
CHLORIDE: 101 mmol/L (ref 96–106)
CO2: 23 mmol/L (ref 20–29)
CREATININE: 0.8 mg/dL (ref 0.57–1.00)
Calcium: 9.9 mg/dL (ref 8.7–10.2)
GFR calc Af Amer: 102 mL/min/{1.73_m2} (ref >59)
GFR calc non Af Amer: 88 mL/min/{1.73_m2} (ref >59)
GLUCOSE: 97 mg/dL (ref 65–99)
Globulin, Total: 2.8 g/dL (ref 1.5–4.5)
Potassium: 4.5 mmol/L (ref 3.5–5.2)
SODIUM: 137 mmol/L (ref 134–144)
Total Protein: 7.2 g/dL (ref 6.0–8.5)

## 2018-03-30 LAB — LIPID PANEL
Chol/HDL Ratio: 3 ratio (ref 0.0–4.4)
Cholesterol, Total: 180 mg/dL (ref 100–199)
HDL: 60 mg/dL (ref >39)
LDL CALC: 107 mg/dL — AB (ref 0–99)
Triglycerides: 66 mg/dL (ref 0–149)
VLDL Cholesterol Cal: 13 mg/dL (ref 5–40)

## 2018-03-30 LAB — MEASLES/MUMPS/RUBELLA IMMUNITY
MUMPS ABS, IGG: <9 AU/mL — ABNORMAL LOW (ref >10.9)
Rubella Antibodies, IGG: 4.61 index (ref >0.99)

## 2018-03-31 ENCOUNTER — Other Ambulatory Visit: Payer: Self-pay

## 2018-03-31 MED ORDER — MEASLES, MUMPS & RUBELLA VAC ~~LOC~~ INJ: 0.5000 mL | INJECTION | Freq: Once | SUBCUTANEOUS | 0 refills | Status: AC

## 2018-03-31 NOTE — Progress Notes (Signed)
Sent Rx for pt to get MMR vaccine at pharmacy per pt req as lab results show she needs a booster/thx dmf

## 2018-04-03 ENCOUNTER — Telehealth: Payer: Self-pay

## 2018-04-03 LAB — PAP LB, RFX HPV ASCU: PAP Smear Comment: 0

## 2018-04-03 NOTE — Telephone Encounter (Signed)
Completed - Add on testing. added high risk HPV testing to existing pap specimen #[id number].  -DMG

## 2018-04-06 ENCOUNTER — Encounter: Payer: Managed Care, Other (non HMO) | Admitting: Family Medicine

## 2018-04-06 LAB — SPECIMEN STATUS REPORT

## 2018-04-06 LAB — HPV DNA PROBE HIGH RISK, AMPLIFIED: HPV, high-risk: NEGATIVE

## 2018-04-17 ENCOUNTER — Encounter: Payer: Self-pay | Admitting: Family Medicine

## 2018-05-11 ENCOUNTER — Other Ambulatory Visit: Payer: Self-pay | Admitting: Family Medicine

## 2018-06-04 ENCOUNTER — Other Ambulatory Visit: Payer: Self-pay | Admitting: Family Medicine

## 2018-06-06 ENCOUNTER — Encounter: Payer: Self-pay | Admitting: Family Medicine

## 2018-06-06 MED ORDER — VENLAFAXINE HCL ER 75 MG PO CP24: ORAL_CAPSULE | ORAL | 0 refills | Status: DC

## 2018-09-01 ENCOUNTER — Other Ambulatory Visit: Payer: Self-pay | Admitting: Family Medicine

## 2018-09-01 DIAGNOSIS — F419 Anxiety disorder, unspecified: Secondary | ICD-10-CM

## 2018-11-13 ENCOUNTER — Encounter: Payer: Self-pay | Admitting: Family Medicine

## 2018-11-14 ENCOUNTER — Other Ambulatory Visit: Payer: Self-pay | Admitting: Family Medicine

## 2018-11-14 DIAGNOSIS — F419 Anxiety disorder, unspecified: Secondary | ICD-10-CM

## 2018-11-15 ENCOUNTER — Other Ambulatory Visit: Payer: Self-pay

## 2018-11-15 ENCOUNTER — Encounter: Payer: Self-pay | Admitting: Family Medicine

## 2019-02-12 ENCOUNTER — Other Ambulatory Visit: Payer: Self-pay | Admitting: Family Medicine

## 2019-02-12 DIAGNOSIS — F419 Anxiety disorder, unspecified: Secondary | ICD-10-CM

## 2019-02-13 ENCOUNTER — Encounter: Payer: Self-pay | Admitting: Family Medicine

## 2019-02-13 ENCOUNTER — Other Ambulatory Visit: Payer: Self-pay | Admitting: Family Medicine

## 2019-02-13 DIAGNOSIS — F419 Anxiety disorder, unspecified: Secondary | ICD-10-CM

## 2019-04-05 ENCOUNTER — Encounter: Payer: Self-pay | Admitting: Family Medicine

## 2019-04-05 ENCOUNTER — Other Ambulatory Visit: Payer: Self-pay | Admitting: Family Medicine

## 2019-04-05 DIAGNOSIS — Z1239 Encounter for other screening for malignant neoplasm of breast: Secondary | ICD-10-CM

## 2019-05-10 ENCOUNTER — Other Ambulatory Visit: Payer: Self-pay | Admitting: Family Medicine

## 2019-05-10 DIAGNOSIS — F419 Anxiety disorder, unspecified: Secondary | ICD-10-CM

## 2019-05-10 NOTE — Telephone Encounter (Signed)
Dr. Deborra Medina please advise okay to send these in? Pt hasn't been seen since 03/2018.

## 2019-05-22 ENCOUNTER — Ambulatory Visit
Admission: RE | Admit: 2019-05-22 | Discharge: 2019-05-22 | Disposition: A | Discharge status: Home / Self Care | Payer: Managed Care, Other (non HMO) | Source: Ambulatory Visit | Attending: Family Medicine | Admitting: Family Medicine

## 2019-05-22 DIAGNOSIS — Z1231 Encounter for screening mammogram for malignant neoplasm of breast: Secondary | ICD-10-CM | POA: Insufficient documentation

## 2019-05-22 DIAGNOSIS — Z1239 Encounter for other screening for malignant neoplasm of breast: Secondary | ICD-10-CM | POA: Diagnosis present

## 2019-08-06 ENCOUNTER — Other Ambulatory Visit: Payer: Self-pay

## 2019-08-06 DIAGNOSIS — F419 Anxiety disorder, unspecified: Secondary | ICD-10-CM

## 2019-08-06 MED ORDER — VENLAFAXINE HCL ER 75 MG PO CP24: ORAL_CAPSULE | ORAL | 0 refills | Status: DC

## 2019-08-06 MED ORDER — TRAZODONE HCL 50 MG PO TABS: ORAL_TABLET | ORAL | 0 refills | Status: DC

## 2019-08-06 NOTE — Addendum Note (Signed)
Addended by: Darral Dash on: 08/06/2019 03:54 PM   Modules accepted: Orders

## 2019-08-06 NOTE — Telephone Encounter (Signed)
Last OV  01/23/2018 Last fill 05/10/19  #90/0

## 2019-08-06 NOTE — Telephone Encounter (Signed)
Last OV  01/23/2018 Last fill 05/10/19  #90/0 

## 2019-09-08 ENCOUNTER — Encounter: Payer: Self-pay | Admitting: Family Medicine

## 2019-09-22 ENCOUNTER — Encounter: Payer: Self-pay | Admitting: Family Medicine

## 2019-09-25 ENCOUNTER — Other Ambulatory Visit: Payer: Self-pay

## 2019-09-25 DIAGNOSIS — F419 Anxiety disorder, unspecified: Secondary | ICD-10-CM

## 2019-09-25 MED ORDER — VENLAFAXINE HCL ER 75 MG PO CP24: ORAL_CAPSULE | ORAL | 1 refills | Status: DC

## 2019-09-25 MED ORDER — TRAZODONE HCL 50 MG PO TABS: ORAL_TABLET | ORAL | 1 refills | Status: DC

## 2019-11-07 ENCOUNTER — Other Ambulatory Visit: Payer: Self-pay

## 2019-11-07 DIAGNOSIS — F419 Anxiety disorder, unspecified: Secondary | ICD-10-CM

## 2019-11-07 MED ORDER — VENLAFAXINE HCL ER 75 MG PO CP24: ORAL_CAPSULE | ORAL | 0 refills | Status: DC

## 2019-11-10 ENCOUNTER — Other Ambulatory Visit: Payer: Self-pay

## 2019-11-10 ENCOUNTER — Ambulatory Visit: Payer: Managed Care, Other (non HMO) | Attending: Internal Medicine

## 2019-11-10 DIAGNOSIS — Z23 Encounter for immunization: Secondary | ICD-10-CM

## 2019-11-10 NOTE — Progress Notes (Signed)
   Covid-19 Vaccination Clinic  Name:  Maria Thornton    MRN: [id number] DOB: 10/28/1970  11/10/2019  [name] was observed post Covid-19 immunization for 15 minutes without incident. She was provided with Vaccine Information Sheet and instruction to access the V-Safe system.   [name] was instructed to call 911 with any severe reactions post vaccine: Marland Kitchen Difficulty breathing  . Swelling of face and throat  . A fast heartbeat  . A bad rash all over body  . Dizziness and weakness   Immunizations Administered    Name Date Dose VIS Date Route   Pfizer COVID-19 Vaccine 11/10/2019  9:18 AM 0.3 mL 08/03/2019 Intramuscular   Manufacturer: [employer], [employer]   Lot: [id number]   NDC: [address]-2

## 2019-12-04 ENCOUNTER — Ambulatory Visit: Payer: Managed Care, Other (non HMO) | Attending: Internal Medicine

## 2019-12-04 DIAGNOSIS — Z23 Encounter for immunization: Secondary | ICD-10-CM

## 2019-12-04 NOTE — Progress Notes (Signed)
   Covid-19 Vaccination Clinic  Name:  Maria Thornton    MRN: [id number] DOB: 16-Dec-1970  12/04/2019  [name] was observed post Covid-19 immunization for 15 minutes without incident. She was provided with Vaccine Information Sheet and instruction to access the V-Safe system.   [name] was instructed to call 911 with any severe reactions post vaccine: Marland Kitchen Difficulty breathing  . Swelling of face and throat  . A fast heartbeat  . A bad rash all over body  . Dizziness and weakness   Immunizations Administered    Name Date Dose VIS Date Route   Pfizer COVID-19 Vaccine 12/04/2019  2:43 PM 0.3 mL 08/03/2019 Intramuscular   Manufacturer: [employer], [employer]   Lot: [id number]   NDC: [address]-0

## 2019-12-27 ENCOUNTER — Encounter: Payer: Self-pay | Admitting: Internal Medicine

## 2019-12-27 ENCOUNTER — Other Ambulatory Visit: Payer: Self-pay

## 2019-12-27 ENCOUNTER — Ambulatory Visit: Payer: Managed Care, Other (non HMO) | Admitting: Internal Medicine

## 2019-12-27 DIAGNOSIS — G4700 Insomnia, unspecified: Secondary | ICD-10-CM

## 2019-12-27 DIAGNOSIS — D509 Iron deficiency anemia, unspecified: Secondary | ICD-10-CM

## 2019-12-27 DIAGNOSIS — F419 Anxiety disorder, unspecified: Secondary | ICD-10-CM

## 2019-12-27 NOTE — Progress Notes (Signed)
HPI  Pt presents to the clinic today to establish care and for management of the conditions listed below. She is transferring care from Dr. Deborra Medina.  Anxiety: Triggered by. She is taking Venlafaxine as prescribed. She is not seeing a therapist. She denies depression, SI/HI.  Insomnia: She has trouble falling and staying asleep.. She is taking Trazadone as prescribed. There is no sleep study on file.  Anemia: Her last H/H was 11.3/35.7, 03/2016. She is not taking an iron supplement OTC.  Flu: 05/2019 Tetanus: 05/2010 Covid: 10/2019, 11/2019 Pap Smear: 03/2018 Mammogram: 04/2019 Colon Screening: never Vision Screening: annually Dentist: annually  Past Medical History:  Diagnosis Date  . Heart murmur    no proph  . History of asthma    in the past, very mild  . History of Bell's palsy    years ago  . Multiple allergies     Current Outpatient Medications  Medication Sig Dispense Refill  . clindamycin-benzoyl peroxide (BENZACLIN) gel Apply 1 application topically daily.     . Doxycycline Hyclate (ACTICLATE) 150 MG TABS Take by mouth daily. TAKE 1/3 TABLET BID    . fluticasone (FLONASE) 50 MCG/ACT nasal spray 2 sprays each nostril once daily 2 sprays each nostril once daily 16 g 3  . RETIN-A MICRO PUMP 0.08 % GEL APPLY ONE APPLICATION TOPICALLY EVERY NIGHT AT BEDTIME  1  . spironolactone (ALDACTONE) 100 MG tablet Take 100 mg by mouth 2 (two) times daily.     . traZODone (DESYREL) 50 MG tablet TAKE 1/2 TO 1 TABLET(25 TO 50 MG) BY MOUTH AT BEDTIME AS NEEDED FOR SLEEP 90 tablet 1  . venlafaxine XR (EFFEXOR-XR) 75 MG 24 hr capsule Take 1qd (Plz sched appt with new provider) 90 capsule 0   No current facility-administered medications for this visit.    Allergies  Allergen Reactions  . Sulfonamide Derivatives     REACTION: rash    Family History  Problem Relation Age of Onset  . Arthritis Mother        rheumatoid  . Hypertension Mother   . Hypertension Father   . Diabetes Maternal  Grandmother   . Arthritis Maternal Grandfather        rheumatoid  . Hyperlipidemia Maternal Grandfather   . Hypertension Maternal Grandfather   . Arthritis Paternal Grandmother   . Breast cancer Maternal Aunt 53    Social History   Socioeconomic History  . Marital status: Married    Spouse name: Not on file  . Number of children: 2  . Years of education: Not on file  . Highest education level: Not on file  Occupational History  . Occupation: Best boy: [employer]  Tobacco Use  . Smoking status: Never Smoker  . Smokeless tobacco: Never Used  Substance and Sexual Activity  . Alcohol use: Yes  . Drug use: No  . Sexual activity: Not on file  Other Topics Concern  . Not on file  Social History Narrative   No regular exercise.   Social Determinants of Health   Financial Resource Strain:   . Difficulty of Paying Living Expenses:   Food Insecurity:   . Worried About Charity fundraiser in the Last Year:   . Arboriculturist in the Last Year:   Transportation Needs:   . Film/video editor (Medical):   Marland Kitchen Lack of Transportation (Non-Medical):   Physical Activity:   . Days of Exercise per Week:   . Minutes of Exercise per  Session:   Stress:   . Feeling of Stress :   Social Connections:   . Frequency of Communication with Friends and Family:   . Frequency of Social Gatherings with Friends and Family:   . Attends Religious Services:   . Active Member of Clubs or Organizations:   . Attends Banker Meetings:   Marland Kitchen Marital Status:   Intimate Partner Violence:   . Fear of Current or Ex-Partner:   . Emotionally Abused:   Marland Kitchen Physically Abused:   . Sexually Abused:     ROS:  Constitutional: Denies fever, malaise, fatigue, headache or abrupt weight changes.  HEENT: Denies eye pain, eye redness, ear pain, ringing in the ears, wax buildup, runny nose, nasal congestion, bloody nose, or sore throat. Respiratory: Denies difficulty breathing, shortness of  breath, cough or sputum production.   Cardiovascular: Denies chest pain, chest tightness, palpitations or swelling in the hands or feet.  Gastrointestinal: Denies abdominal pain, bloating, constipation, diarrhea or blood in the stool.  GU: Denies frequency, urgency, pain with urination, blood in urine, odor or discharge. Musculoskeletal: Denies decrease in range of motion, difficulty with gait, muscle pain or joint pain and swelling.  Skin: Pt reports bruising to left shin. Denies redness, rashes, lesions or ulcercations.  Neurological: Denies dizziness, difficulty with memory, difficulty with speech or problems with balance and coordination.  Psych: Pt has a history of anxiety. Denies depression, SI/HI.  No other specific complaints in a complete review of systems (except as listed in HPI above).  PE: Blood pressure 122/82, pulse 80, temperature (!) 97.4 F (36.3 C), temperature source Temporal, height 5' 3.75" (1.619 m), weight 158 lb (71.7 kg), SpO2 98 %.  s Wt Readings from Last 3 Encounters:  03/29/18 182 lb 6.4 oz (82.7 kg)  01/23/18 183 lb 12.8 oz (83.4 kg)  04/11/17 168 lb 12.8 oz (76.6 kg)    General: Appears her stated age, well developed, well nourished in NAD. Skin: Bruise noted to LLE. HEENT: Head: normal shape and size;  Cardiovascular: Normal rate and rhythm. S1,S2 noted.  No murmur, rubs or gallops noted.  Pulmonary/Chest: Normal effort and positive vesicular breath sounds. No respiratory distress. No wheezes, rales or ronchi noted.  Musculoskeletal:  No difficulty with gait.  Neurological: Alert and oriented.   Psychiatric: Mood and affect normal. Behavior is normal. Judgment and thought content normal.     BMET    Component Value Date/Time   NA 137 03/29/2018 1150   K 4.5 03/29/2018 1150   CL 101 03/29/2018 1150   CO2 23 03/29/2018 1150   GLUCOSE 97 03/29/2018 1150   BUN 14 03/29/2018 1150   CREATININE 0.80 03/29/2018 1150   CALCIUM 9.9 03/29/2018 1150    GFRNONAA 88 03/29/2018 1150   GFRAA 102 03/29/2018 1150    Lipid Panel     Component Value Date/Time   CHOL 180 03/29/2018 1150   TRIG 66 03/29/2018 1150   HDL 60 03/29/2018 1150   CHOLHDL 3.0 03/29/2018 1150   LDLCALC 107 (H) 03/29/2018 1150    CBC    Component Value Date/Time   WBC 4.7 04/01/2016 0000   RBC 4.21 04/01/2016 0000   HGB 11.3 04/01/2016 0000   HCT 35.7 04/01/2016 0000   PLT 275 04/01/2016 0000   MCV 85 04/01/2016 0000   MCH 26.8 04/01/2016 0000   MCHC 31.7 04/01/2016 0000   RDW 14.6 04/01/2016 0000   LYMPHSABS 1.7 04/01/2016 0000   EOSABS 0.3 04/01/2016 0000  BASOSABS 0.1 04/01/2016 0000    Hgb A1C Lab Results  Component Value Date   HGBA1C 5.7 (H) 03/29/2018     Assessment and Plan:   Nicki Reaper, NP This visit occurred during the SARS-CoV-2 public health emergency.  Safety protocols were in place, including screening questions prior to the visit, additional usage of staff PPE, and extensive cleaning of exam room while observing appropriate contact time as indicated for disinfecting solutions.

## 2019-12-27 NOTE — Assessment & Plan Note (Signed)
Will check CBC with annual exam 

## 2019-12-27 NOTE — Assessment & Plan Note (Signed)
Continue Venlafaxine Will monitor

## 2019-12-27 NOTE — Patient Instructions (Signed)

## 2019-12-27 NOTE — Assessment & Plan Note (Signed)
Continue Trazadone Will monitor 

## 2020-01-28 ENCOUNTER — Encounter: Payer: Self-pay | Admitting: Internal Medicine

## 2020-02-05 ENCOUNTER — Ambulatory Visit (INDEPENDENT_AMBULATORY_CARE_PROVIDER_SITE_OTHER): Payer: Managed Care, Other (non HMO) | Admitting: Internal Medicine

## 2020-02-05 ENCOUNTER — Other Ambulatory Visit: Payer: Self-pay

## 2020-02-05 ENCOUNTER — Encounter: Payer: Self-pay | Admitting: Internal Medicine

## 2020-02-05 VITALS — BP 122/76 | HR 76 | Temp 97.6°F | Ht 64.0 in | Wt 159.0 lb

## 2020-02-05 DIAGNOSIS — Z1211 Encounter for screening for malignant neoplasm of colon: Secondary | ICD-10-CM

## 2020-02-05 DIAGNOSIS — Z23 Encounter for immunization: Secondary | ICD-10-CM

## 2020-02-05 DIAGNOSIS — Z0001 Encounter for general adult medical examination with abnormal findings: Secondary | ICD-10-CM

## 2020-02-05 DIAGNOSIS — D509 Iron deficiency anemia, unspecified: Secondary | ICD-10-CM | POA: Diagnosis not present

## 2020-02-05 DIAGNOSIS — L731 Pseudofolliculitis barbae: Secondary | ICD-10-CM

## 2020-02-05 DIAGNOSIS — Z1152 Encounter for screening for COVID-19: Secondary | ICD-10-CM

## 2020-02-05 NOTE — Addendum Note (Signed)
Addended by: Alvina Chou on: 02/05/2020 09:38 AM   Modules accepted: Orders

## 2020-02-05 NOTE — Progress Notes (Signed)
Subjective:    Patient ID: Maria Thornton, female    DOB: Jul 08, 1971, 49 y.o.   MRN: [id number]  HPI  Patient presents to the clinic today for her annual exam.  Flu: 05/2019 Tetanus: 05/2010 Covid: 10/2019, 11/2019. Pap smear: 03/2018 Mammogram: 04/2019 Colon screening: never  Vision Screening: annually Dentist: annually  Diet: She does eat meat. She consumes more veggies than fruit. She does not eat fried foods. She drinks mostly water. Exercise: None  Review of Systems      Past Medical History:  Diagnosis Date  . Heart murmur    no proph  . History of asthma    in the past, very mild  . History of Bell's palsy    years ago  . Multiple allergies     Current Outpatient Medications  Medication Sig Dispense Refill  . calcium-vitamin D (OSCAL WITH D) 500-200 MG-UNIT tablet Take 1 tablet by mouth.    . fluticasone (FLONASE) 50 MCG/ACT nasal spray 2 sprays each nostril once daily 2 sprays each nostril once daily 16 g 3  . Magnesium Citrate 200 MG TABS Take by mouth.    . traZODone (DESYREL) 50 MG tablet TAKE 1/2 TO 1 TABLET(25 TO 50 MG) BY MOUTH AT BEDTIME AS NEEDED FOR SLEEP 90 tablet 1  . venlafaxine XR (EFFEXOR-XR) 75 MG 24 hr capsule Take 1qd (Plz sched appt with new provider) 90 capsule 0   No current facility-administered medications for this visit.    Allergies  Allergen Reactions  . Sulfonamide Derivatives     REACTION: rash    Family History  Problem Relation Age of Onset  . Arthritis Mother        rheumatoid  . Hypertension Mother   . COPD Mother   . Hypertension Father   . COPD Father   . Diabetes Maternal Grandmother   . Asthma Maternal Grandmother   . Arthritis Maternal Grandfather        rheumatoid  . Hyperlipidemia Maternal Grandfather   . Hypertension Maternal Grandfather   . Arthritis Paternal Grandmother   . Breast cancer Maternal Aunt 53    Social History   Socioeconomic History  . Marital status: Married    Spouse name: Not on  file  . Number of children: 2  . Years of education: Not on file  . Highest education level: Not on file  Occupational History  . Occupation: Best boy: [employer]  Tobacco Use  . Smoking status: Never Smoker  . Smokeless tobacco: Never Used  Vaping Use  . Vaping Use: Never used  Substance and Sexual Activity  . Alcohol use: Yes  . Drug use: No  . Sexual activity: Not on file  Other Topics Concern  . Not on file  Social History Narrative   No regular exercise.   Social Determinants of Health   Financial Resource Strain:   . Difficulty of Paying Living Expenses:   Food Insecurity:   . Worried About Charity fundraiser in the Last Year:   . Arboriculturist in the Last Year:   Transportation Needs:   . Film/video editor (Medical):   Marland Kitchen Lack of Transportation (Non-Medical):   Physical Activity:   . Days of Exercise per Week:   . Minutes of Exercise per Session:   Stress:   . Feeling of Stress :   Social Connections:   . Frequency of Communication with Friends and Family:   . Frequency of Social  Gatherings with Friends and Family:   . Attends Religious Services:   . Active Member of Clubs or Organizations:   . Attends Archivist Meetings:   Marland Kitchen Marital Status:   Intimate Partner Violence:   . Fear of Current or Ex-Partner:   . Emotionally Abused:   Marland Kitchen Physically Abused:   . Sexually Abused:      Constitutional: Denies fever, malaise, fatigue, headache or abrupt weight changes.  HEENT: Denies eye pain, eye redness, ear pain, ringing in the ears, wax buildup, runny nose, nasal congestion, bloody nose, or sore throat. Respiratory: Denies difficulty breathing, shortness of breath, cough or sputum production.   Cardiovascular: Denies chest pain, chest tightness, palpitations or swelling in the hands or feet.  Gastrointestinal: Pt reports intermittent constipation. Denies abdominal pain, bloating, diarrhea or blood in the stool.  GU: Denies urgency,  frequency, pain with urination, burning sensation, blood in urine, odor or discharge. Musculoskeletal: Pt reports intermittent low back pain. Denies decrease in range of motion, difficulty with gait, muscle pain or joint swelling.  Skin: Denies redness, rashes, lesions or ulcercations.  Neurological: Pt reports insomnia. Denies dizziness, difficulty with memory, difficulty with speech or problems with balance and coordination.  Psych: Pt reports anxiety. Denies depression, SI/HI.  No other specific complaints in a complete review of systems (except as listed in HPI above).  Objective:   Physical Exam  BP 122/76   Pulse 76   Temp 97.6 F (36.4 C) (Temporal)   Ht _0  (1.626 m)   Wt 159 lb (72.1 kg)   SpO2 98%   BMI 27.29 kg/m   Wt Readings from Last 3 Encounters:  12/27/19 158 lb (71.7 kg)  03/29/18 182 lb 6.4 oz (82.7 kg)  01/23/18 183 lb 12.8 oz (83.4 kg)    General: Appearsher stated age, well developed, well nourished in NAD. Skin: Warm, dry and intact. Ingrown hair noted left posterior hairline. HEENT: Head: normal shape and size; Eyes: sclera white, no icterus, conjunctiva pink, PERRLA and EOMs intact;  Neck:  Neck supple, trachea midline. No masses, lumps or thyromegaly present.  Cardiovascular: Normal rate and rhythm. S1,S2 noted.  No murmur, rubs or gallops noted. No JVD or BLE edema.  Pulmonary/Chest: Normal effort and positive vesicular breath sounds. No respiratory distress. No wheezes, rales or ronchi noted.  Abdomen: Soft and nontender. Normal bowel sounds. No distention or masses noted. Liver, spleen and kidneys non palpable. Musculoskeletal: Strength 5/5 BUE/BLE. No difficulty with gait.  Neurological: Alert and oriented. Cranial nerves II-XII grossly intact. Coordination normal.  Psychiatric: Mood and affect normal. Behavior is normal. Judgment and thought content normal.    BMET    Component Value Date/Time   NA 137 03/29/2018 1150   K 4.5 03/29/2018 1150    CL 101 03/29/2018 1150   CO2 23 03/29/2018 1150   GLUCOSE 97 03/29/2018 1150   BUN 14 03/29/2018 1150   CREATININE 0.80 03/29/2018 1150   CALCIUM 9.9 03/29/2018 1150   GFRNONAA 88 03/29/2018 1150   GFRAA 102 03/29/2018 1150    Lipid Panel     Component Value Date/Time   CHOL 180 03/29/2018 1150   TRIG 66 03/29/2018 1150   HDL 60 03/29/2018 1150   CHOLHDL 3.0 03/29/2018 1150   LDLCALC 107 (H) 03/29/2018 1150    CBC    Component Value Date/Time   WBC 4.7 04/01/2016 0000   RBC 4.21 04/01/2016 0000   HGB 11.3 04/01/2016 0000   HCT 35.7 04/01/2016 0000  PLT 275 04/01/2016 0000   MCV 85 04/01/2016 0000   MCH 26.8 04/01/2016 0000   MCHC 31.7 04/01/2016 0000   RDW 14.6 04/01/2016 0000   LYMPHSABS 1.7 04/01/2016 0000   EOSABS 0.3 04/01/2016 0000   BASOSABS 0.1 04/01/2016 0000    Hgb A1C Lab Results  Component Value Date   HGBA1C 5.7 (H) 03/29/2018           Assessment & Plan:   Preventative Health Maintenance:  Encouraged her to get a flu shot the fall Tetanus today Covid UTD Pap smear UTD Mammogram due 04/2020, she will call to schedule Referral to GI for screening colonoscopy Encouraged her to consume a balanced diet and exercise regimen Advised her to see an eye doctor and dentist annually We will check CBC, C met, lipid, vitamin D today  Ingrown Hair:  No intervention needed She has been picking at this Will monitor  RTC in 1 year, sooner if needed Webb Silversmith, NP This visit occurred during the SARS-CoV-2 public health emergency.  Safety protocols were in place, including screening questions prior to the visit, additional usage of staff PPE, and extensive cleaning of exam room while observing appropriate contact time as indicated for disinfecting solutions.

## 2020-02-05 NOTE — Patient Instructions (Signed)
Health Maintenance, Female Adopting a healthy lifestyle and getting preventive care are important in promoting health and wellness. Ask your health care provider about:  The right schedule for you to have regular tests and exams.  Things you can do on your own to prevent diseases and keep yourself healthy. What should I know about diet, weight, and exercise? Eat a healthy diet   Eat a diet that includes plenty of vegetables, fruits, low-fat dairy products, and lean protein.  Do not eat a lot of foods that are high in solid fats, added sugars, or sodium. Maintain a healthy weight Body mass index (BMI) is used to identify weight problems. It estimates body fat based on height and weight. Your health care provider can help determine your BMI and help you achieve or maintain a healthy weight. Get regular exercise Get regular exercise. This is one of the most important things you can do for your health. Most adults should:  Exercise for at least 150 minutes each week. The exercise should increase your heart rate and make you sweat (moderate-intensity exercise).  Do strengthening exercises at least twice a week. This is in addition to the moderate-intensity exercise.  Spend less time sitting. Even light physical activity can be beneficial. Watch cholesterol and blood lipids Have your blood tested for lipids and cholesterol at 49 years of age, then have this test every 5 years. Have your cholesterol levels checked more often if:  Your lipid or cholesterol levels are high.  You are older than 49 years of age.  You are at high risk for heart disease. What should I know about cancer screening? Depending on your health history and family history, you may need to have cancer screening at various ages. This may include screening for:  Breast cancer.  Cervical cancer.  Colorectal cancer.  Skin cancer.  Lung cancer. What should I know about heart disease, diabetes, and high blood  pressure? Blood pressure and heart disease  High blood pressure causes heart disease and increases the risk of stroke. This is more likely to develop in people who have high blood pressure readings, are of African descent, or are overweight.  Have your blood pressure checked: ? Every 3-5 years if you are 18-39 years of age. ? Every year if you are 40 years old or older. Diabetes Have regular diabetes screenings. This checks your fasting blood sugar level. Have the screening done:  Once every three years after age 40 if you are at a normal weight and have a low risk for diabetes.  More often and at a younger age if you are overweight or have a high risk for diabetes. What should I know about preventing infection? Hepatitis B If you have a higher risk for hepatitis B, you should be screened for this virus. Talk with your health care provider to find out if you are at risk for hepatitis B infection. Hepatitis C Testing is recommended for:  Everyone born from 1945 through 1965.  Anyone with known risk factors for hepatitis C. Sexually transmitted infections (STIs)  Get screened for STIs, including gonorrhea and chlamydia, if: ? You are sexually active and are younger than 49 years of age. ? You are older than 49 years of age and your health care provider tells you that you are at risk for this type of infection. ? Your sexual activity has changed since you were last screened, and you are at increased risk for chlamydia or gonorrhea. Ask your health care provider if   you are at risk.  Ask your health care provider about whether you are at high risk for HIV. Your health care provider may recommend a prescription medicine to help prevent HIV infection. If you choose to take medicine to prevent HIV, you should first get tested for HIV. You should then be tested every 3 months for as long as you are taking the medicine. Pregnancy  If you are about to stop having your period (premenopausal) and  you may become pregnant, seek counseling before you get pregnant.  Take 400 to 800 micrograms (mcg) of folic acid every day if you become pregnant.  Ask for birth control (contraception) if you want to prevent pregnancy. Osteoporosis and menopause Osteoporosis is a disease in which the bones lose minerals and strength with aging. This can result in bone fractures. If you are 65 years old or older, or if you are at risk for osteoporosis and fractures, ask your health care provider if you should:  Be screened for bone loss.  Take a calcium or vitamin D supplement to lower your risk of fractures.  Be given hormone replacement therapy (HRT) to treat symptoms of menopause. Follow these instructions at home: Lifestyle  Do not use any products that contain nicotine or tobacco, such as cigarettes, e-cigarettes, and chewing tobacco. If you need help quitting, ask your health care provider.  Do not use street drugs.  Do not share needles.  Ask your health care provider for help if you need support or information about quitting drugs. Alcohol use  Do not drink alcohol if: ? Your health care provider tells you not to drink. ? You are pregnant, may be pregnant, or are planning to become pregnant.  If you drink alcohol: ? Limit how much you use to 0-1 drink a day. ? Limit intake if you are breastfeeding.  Be aware of how much alcohol is in your drink. In the [place] one drink equals one 12 oz bottle of beer (355 mL), one 5 oz glass of wine (148 mL), or one 1 oz glass of hard liquor (44 mL). General instructions  Schedule regular health, dental, and eye exams.  Stay current with your vaccines.  Tell your health care provider if: ? You often feel depressed. ? You have ever been abused or do not feel safe at home. Summary  Adopting a healthy lifestyle and getting preventive care are important in promoting health and wellness.  Follow your health care provider's instructions about healthy  diet, exercising, and getting tested or screened for diseases.  Follow your health care provider's instructions on monitoring your cholesterol and blood pressure. This information is not intended to replace advice given to you by your health care provider. Make sure you discuss any questions you have with your health care provider. Document Revised: 08/02/2018 Document Reviewed: 08/02/2018 Elsevier Patient Education  2020 Elsevier Inc.  

## 2020-02-05 NOTE — Addendum Note (Signed)
Addended by: Roena Malady on: 02/05/2020 04:42 PM   Modules accepted: Orders

## 2020-02-06 LAB — COMPREHENSIVE METABOLIC PANEL
ALT: 31 IU/L (ref 0–32)
AST: 25 IU/L (ref 0–40)
Albumin/Globulin Ratio: 2.1 (ref 1.2–2.2)
Albumin: 4.8 g/dL (ref 3.8–4.8)
Alkaline Phosphatase: 70 IU/L (ref 48–121)
BUN/Creatinine Ratio: 15 (ref 9–23)
BUN: 13 mg/dL (ref 6–24)
Bilirubin Total: 0.5 mg/dL (ref 0.0–1.2)
CO2: 24 mmol/L (ref 20–29)
Calcium: 10.3 mg/dL — ABNORMAL HIGH (ref 8.7–10.2)
Chloride: 102 mmol/L (ref 96–106)
Creatinine, Ser: 0.87 mg/dL (ref 0.57–1.00)
GFR calc Af Amer: 90 mL/min/{1.73_m2} (ref >59)
GFR calc non Af Amer: 78 mL/min/{1.73_m2} (ref >59)
Globulin, Total: 2.3 g/dL (ref 1.5–4.5)
Glucose: 94 mg/dL (ref 65–99)
Potassium: 4.6 mmol/L (ref 3.5–5.2)
Sodium: 141 mmol/L (ref 134–144)
Total Protein: 7.1 g/dL (ref 6.0–8.5)

## 2020-02-06 LAB — TRANSFERRIN: Transferrin: 283 mg/dL (ref 192–364)

## 2020-02-06 LAB — CBC
Hematocrit: 44.6 % (ref 34.0–46.6)
Hemoglobin: 15.1 g/dL (ref 11.1–15.9)
MCH: 31.1 pg (ref 26.6–33.0)
MCHC: 33.9 g/dL (ref 31.5–35.7)
MCV: 92 fL (ref 79–97)
Platelets: 217 10*3/uL (ref 150–450)
RBC: 4.86 x10E6/uL (ref 3.77–5.28)
RDW: 12.9 % (ref 11.7–15.4)
WBC: 4.2 10*3/uL (ref 3.4–10.8)

## 2020-02-06 LAB — IRON AND TIBC
Iron Saturation: 56 % — ABNORMAL HIGH (ref 15–55)
Iron: 186 ug/dL — ABNORMAL HIGH (ref 27–159)
Total Iron Binding Capacity: 334 ug/dL (ref 250–450)
UIBC: 148 ug/dL (ref 131–425)

## 2020-02-06 LAB — LIPID PANEL
Chol/HDL Ratio: 2.8 ratio (ref 0.0–4.4)
Cholesterol, Total: 193 mg/dL (ref 100–199)
HDL: 70 mg/dL (ref >39)
LDL Chol Calc (NIH): 115 mg/dL — ABNORMAL HIGH (ref 0–99)
Triglycerides: 43 mg/dL (ref 0–149)
VLDL Cholesterol Cal: 8 mg/dL (ref 5–40)

## 2020-02-06 LAB — HEMOGLOBIN A1C
Est. average glucose Bld gHb Est-mCnc: 105 mg/dL
Hgb A1c MFr Bld: 5.3 % (ref 4.8–5.6)

## 2020-02-06 LAB — SAR COV2 SEROLOGY (COVID19)AB(IGG),IA: DiaSorin SARS-CoV-2 Ab, IgG: POSITIVE

## 2020-02-06 LAB — VITAMIN D 25 HYDROXY (VIT D DEFICIENCY, FRACTURES): Vit D, 25-Hydroxy: 48.5 ng/mL (ref 30.0–100.0)

## 2020-02-06 LAB — FERRITIN: Ferritin: 77 ng/mL (ref 15–150)

## 2020-02-07 ENCOUNTER — Encounter: Payer: Self-pay | Admitting: Internal Medicine

## 2020-02-08 ENCOUNTER — Telehealth: Payer: Self-pay

## 2020-02-08 ENCOUNTER — Encounter: Payer: Self-pay | Admitting: Internal Medicine

## 2020-02-08 ENCOUNTER — Other Ambulatory Visit: Payer: Self-pay | Admitting: Family Medicine

## 2020-02-08 DIAGNOSIS — F419 Anxiety disorder, unspecified: Secondary | ICD-10-CM

## 2020-02-08 MED ORDER — VENLAFAXINE HCL ER 75 MG PO CP24: 75.0000 mg | ORAL_CAPSULE | Freq: Every day | ORAL | 2 refills | Status: DC

## 2020-02-08 NOTE — Telephone Encounter (Signed)
Health screening form faxed and copy sent to scan, the original mailed to pt

## 2020-02-15 ENCOUNTER — Telehealth (INDEPENDENT_AMBULATORY_CARE_PROVIDER_SITE_OTHER): Payer: Self-pay | Admitting: Gastroenterology

## 2020-02-15 ENCOUNTER — Other Ambulatory Visit: Payer: Self-pay

## 2020-02-15 DIAGNOSIS — Z1211 Encounter for screening for malignant neoplasm of colon: Secondary | ICD-10-CM

## 2020-02-15 NOTE — Progress Notes (Signed)
Gastroenterology Pre-Procedure Review  Request Date: 04/14/20  Requesting Physician: Dr. Tobi Bastos  PATIENT REVIEW QUESTIONS: The patient responded to the following health history questions as indicated:    1. Are you having any GI issues? no 2. Do you have a personal history of Polyps? no 3. Do you have a family history of Colon Cancer or Polyps? no 4. Diabetes Mellitus? no 5. Joint replacements in the past 12 months?no 6. Major health problems in the past 3 months?no 7. Any artificial heart valves, MVP, or defibrillator?no    MEDICATIONS & ALLERGIES:    Patient reports the following regarding taking any anticoagulation/antiplatelet therapy:   Plavix, Coumadin, Eliquis, Xarelto, Lovenox, Pradaxa, Brilinta, or Effient? no Aspirin? no  Patient confirms/reports the following medications:  Current Outpatient Medications  Medication Sig Dispense Refill   calcium-vitamin D (OSCAL WITH D) 500-200 MG-UNIT tablet Take 1 tablet by mouth.     fluticasone (FLONASE) 50 MCG/ACT nasal spray 2 sprays each nostril once daily 2 sprays each nostril once daily 16 g 3   Magnesium Citrate 200 MG TABS Take by mouth.     traZODone (DESYREL) 50 MG tablet TAKE 1/2 TO 1 TABLET(25 TO 50 MG) BY MOUTH AT BEDTIME AS NEEDED FOR SLEEP 90 tablet 1   venlafaxine XR (EFFEXOR-XR) 75 MG 24 hr capsule Take 1 capsule (75 mg total) by mouth daily with breakfast. 90 capsule 2   No current facility-administered medications for this visit.    Patient confirms/reports the following allergies:  Allergies  Allergen Reactions   Sulfonamide Derivatives     REACTION: rash    No orders of the defined types were placed in this encounter.   AUTHORIZATION INFORMATION Primary Insurance: 1D#: Group #:  Secondary Insurance: 1D#: Group #:  SCHEDULE INFORMATION: Date: 04/14/20 Time: Location:[facility]

## 2020-04-10 ENCOUNTER — Other Ambulatory Visit
Admission: RE | Admit: 2020-04-10 | Discharge: 2020-04-10 | Disposition: A | Discharge status: Home / Self Care | Payer: Managed Care, Other (non HMO) | Source: Ambulatory Visit | Attending: Gastroenterology | Admitting: Gastroenterology

## 2020-04-10 ENCOUNTER — Other Ambulatory Visit: Payer: Self-pay

## 2020-04-10 DIAGNOSIS — Z01812 Encounter for preprocedural laboratory examination: Secondary | ICD-10-CM | POA: Diagnosis not present

## 2020-04-10 DIAGNOSIS — Z20822 Contact with and (suspected) exposure to covid-19: Secondary | ICD-10-CM | POA: Insufficient documentation

## 2020-04-11 LAB — SARS CORONAVIRUS 2 (TAT 6-24 HRS): SARS Coronavirus 2: NEGATIVE

## 2020-04-14 ENCOUNTER — Ambulatory Visit: Payer: Managed Care, Other (non HMO) | Admitting: Certified Registered"

## 2020-04-14 ENCOUNTER — Encounter
Admission: RE | Disposition: A | Discharge status: Home / Self Care | Payer: Self-pay | Source: Home / Self Care | Attending: Gastroenterology

## 2020-04-14 ENCOUNTER — Ambulatory Visit
Admission: RE | Admit: 2020-04-14 | Discharge: 2020-04-14 | Disposition: A | Discharge status: Home / Self Care | Payer: Managed Care, Other (non HMO) | Attending: Gastroenterology | Admitting: Gastroenterology

## 2020-04-14 ENCOUNTER — Other Ambulatory Visit: Payer: Self-pay

## 2020-04-14 DIAGNOSIS — Z1211 Encounter for screening for malignant neoplasm of colon: Secondary | ICD-10-CM | POA: Insufficient documentation

## 2020-04-14 DIAGNOSIS — Z79899 Other long term (current) drug therapy: Secondary | ICD-10-CM | POA: Insufficient documentation

## 2020-04-14 DIAGNOSIS — F419 Anxiety disorder, unspecified: Secondary | ICD-10-CM | POA: Insufficient documentation

## 2020-04-14 DIAGNOSIS — Z882 Allergy status to sulfonamides status: Secondary | ICD-10-CM | POA: Insufficient documentation

## 2020-04-14 HISTORY — PX: COLONOSCOPY WITH PROPOFOL: SHX5780

## 2020-04-14 LAB — POCT PREGNANCY, URINE: Preg Test, Ur: NEGATIVE

## 2020-04-14 SURGERY — COLONOSCOPY WITH PROPOFOL
Anesthesia: General

## 2020-04-14 MED ORDER — PHENYLEPHRINE HCL (PRESSORS) 10 MG/ML IV SOLN
INTRAVENOUS | Status: AC
  Filled 2020-04-14: qty 1

## 2020-04-14 MED ORDER — MIDAZOLAM HCL 2 MG/2ML IJ SOLN
INTRAMUSCULAR | Status: DC | PRN
  Administered 2020-04-14: 2 mg via INTRAVENOUS

## 2020-04-14 MED ORDER — LIDOCAINE HCL (CARDIAC) PF 100 MG/5ML IV SOSY
PREFILLED_SYRINGE | INTRAVENOUS | Status: DC | PRN
  Administered 2020-04-14: 100 mg via INTRAVENOUS

## 2020-04-14 MED ORDER — EPHEDRINE 5 MG/ML INJ
INTRAVENOUS | Status: AC
  Filled 2020-04-14: qty 4

## 2020-04-14 MED ORDER — SODIUM CHLORIDE 0.9 % IV SOLN
INTRAVENOUS | Status: DC
  Administered 2020-04-14: 20 mL/h via INTRAVENOUS

## 2020-04-14 MED ORDER — PROPOFOL 10 MG/ML IV BOLUS
INTRAVENOUS | Status: DC | PRN
  Administered 2020-04-14: 60 mg via INTRAVENOUS

## 2020-04-14 MED ORDER — LIDOCAINE HCL (PF) 2 % IJ SOLN
INTRAMUSCULAR | Status: AC
  Filled 2020-04-14: qty 10

## 2020-04-14 MED ORDER — EPINEPHRINE 1 MG/10ML IJ SOSY
PREFILLED_SYRINGE | INTRAMUSCULAR | Status: AC
  Filled 2020-04-14: qty 10

## 2020-04-14 MED ORDER — PROPOFOL 500 MG/50ML IV EMUL
INTRAVENOUS | Status: AC
  Filled 2020-04-14: qty 300

## 2020-04-14 MED ORDER — LIDOCAINE HCL (PF) 2 % IJ SOLN
INTRAMUSCULAR | Status: AC
  Filled 2020-04-14: qty 30

## 2020-04-14 MED ORDER — GLYCOPYRROLATE 0.2 MG/ML IJ SOLN
INTRAMUSCULAR | Status: AC
  Filled 2020-04-14: qty 1

## 2020-04-14 MED ORDER — PROPOFOL 500 MG/50ML IV EMUL
INTRAVENOUS | Status: DC | PRN
  Administered 2020-04-14: 165 ug/kg/min via INTRAVENOUS

## 2020-04-14 MED ORDER — MIDAZOLAM HCL 2 MG/2ML IJ SOLN
INTRAMUSCULAR | Status: AC
  Filled 2020-04-14: qty 2

## 2020-04-14 MED ORDER — VASOPRESSIN 20 UNIT/ML IV SOLN
INTRAVENOUS | Status: AC
  Filled 2020-04-14: qty 1

## 2020-04-14 MED ORDER — EPHEDRINE 5 MG/ML INJ
INTRAVENOUS | Status: AC
  Filled 2020-04-14: qty 10

## 2020-04-14 NOTE — Op Note (Signed)
[facility] Gastroenterology Patient Name: Maria Thornton Procedure Date: 04/14/2020 7:19 AM MRN: [id number] Account #: [id number] Date of Birth: 1971-06-28 Admit Type: Outpatient Age: 49 Room: [facility] ENDO ROOM 4 Gender: Female Note Status: Finalized Procedure:             Colonoscopy Indications:           Screening for colorectal malignant neoplasm Providers:             Wyline Mood MD, MD Referring MD:          Lorre Munroe (Referring MD) Medicines:             Monitored Anesthesia Care Complications:         No immediate complications. Procedure:             Pre-Anesthesia Assessment:                        - Prior to the procedure, a History and Physical was                         performed, and patient medications, allergies and                         sensitivities were reviewed. The patient's tolerance                         of previous anesthesia was reviewed.                        - The risks and benefits of the procedure and the                         sedation options and risks were discussed with the                         patient. All questions were answered and informed                         consent was obtained.                        - ASA Grade Assessment: II - A patient with mild                         systemic disease.                        After obtaining informed consent, the colonoscope was                         passed under direct vision. Throughout the procedure,                         the patient's blood pressure, pulse, and oxygen                         saturations were monitored continuously. The                         Colonoscope was introduced through the anus and  advanced to the the cecum, identified by the                         appendiceal orifice. The colonoscopy was performed                         with ease. The patient tolerated the procedure well.                         The quality of  the bowel preparation was excellent. Findings:      The perianal and digital rectal examinations were normal.      The entire examined colon appeared normal on direct and retroflexion       views. Impression:            - The entire examined colon is normal on direct and                         retroflexion views.                        - No specimens collected. Recommendation:        - Discharge patient to home (with escort).                        - Resume previous diet.                        - Continue present medications.                        - Repeat colonoscopy in 10 years for screening                         purposes. Procedure Code(s):     --- Professional ---                        [phone], Colonoscopy, flexible; diagnostic, including                         collection of specimen(s) by brushing or washing, when                         performed (separate procedure) Diagnosis Code(s):     --- Professional ---                        Z12.11, Encounter for screening for malignant neoplasm                         of colon CPT copyright 2019 American Medical Association. All rights reserved. The codes documented in this report are preliminary and upon coder review may  be revised to meet current compliance requirements. Wyline Mood, MD Wyline Mood MD, MD 04/14/2020 8:42:23 AM This report has been signed electronically. Number of Addenda: 0 Note Initiated On: 04/14/2020 7:19 AM Scope Withdrawal Time: 0 hours 17 minutes 12 seconds  Total Procedure Duration: 0 hours 21 minutes 59 seconds  Estimated Blood Loss:  Estimated blood loss: none.      [facility]

## 2020-04-14 NOTE — Transfer of Care (Addendum)
Immediate Anesthesia Transfer of Care Note  Patient: Maria Thornton  Procedure(s) Performed: COLONOSCOPY WITH PROPOFOL (N/A )  Patient Location: Endoscopy Unit  Anesthesia Type:General  Level of Consciousness: drowsy and responds to stimulation  Airway & Oxygen Therapy: Patient Spontanous Breathing and Patient connected to face mask oxygen  Post-op Assessment: Report given to RN and Post -op Vital signs reviewed and stable  Post vital signs: Reviewed and stable  Last Vitals:  Vitals Value Taken Time  BP 102/67 04/14/20 0844  Temp 35.7 C 04/14/20 0843  Pulse 61 04/14/20 0845  Resp 19 04/14/20 0845  SpO2 100 % 04/14/20 0845  Vitals shown include unvalidated device data.  Last Pain:  Vitals:   04/14/20 0843  TempSrc: Temporal  PainSc:          Complications: No complications documented.

## 2020-04-14 NOTE — Anesthesia Postprocedure Evaluation (Signed)
Anesthesia Post Note  Patient: DHRUVI CRENSHAW  Procedure(s) Performed: COLONOSCOPY WITH PROPOFOL (N/A )  Patient location during evaluation: PACU Anesthesia Type: General Level of consciousness: awake and alert Pain management: pain level controlled Vital Signs Assessment: post-procedure vital signs reviewed and stable Respiratory status: spontaneous breathing, nonlabored ventilation, respiratory function stable and patient connected to nasal cannula oxygen Cardiovascular status: blood pressure returned to baseline and stable Postop Assessment: no apparent nausea or vomiting Anesthetic complications: no   No complications documented.   Last Vitals:  Vitals:   04/14/20 0853 04/14/20 0903  BP: 118/77 126/83  Pulse:    Resp:    Temp:    SpO2:      Last Pain:  Vitals:   04/14/20 0913  TempSrc:   PainSc: 0-No pain                 Yevette Edwards

## 2020-04-14 NOTE — Anesthesia Preprocedure Evaluation (Signed)
Anesthesia Evaluation  Patient identified by MRN, date of birth, ID band Patient awake    Reviewed: Allergy & Precautions, H&P , NPO status , Patient's Chart, lab work & pertinent test results, reviewed documented beta blocker date and time   Airway Mallampati: II   Neck ROM: full    Dental  (+) Teeth Intact   Pulmonary neg pulmonary ROS,    Pulmonary exam normal        Cardiovascular Exercise Tolerance: Good Normal cardiovascular exam+ Valvular Problems/Murmurs  Rhythm:regular Rate:Normal     Neuro/Psych Anxiety negative neurological ROS  negative psych ROS   GI/Hepatic negative GI ROS, Neg liver ROS,   Endo/Other  negative endocrine ROS  Renal/GU negative Renal ROS  negative genitourinary   Musculoskeletal   Abdominal   Peds  Hematology  (+) Blood dyscrasia, anemia ,   Anesthesia Other Findings Past Medical History: No date: Heart murmur     Comment:  no proph No date: History of asthma     Comment:  in the past, very mild No date: History of Bell's palsy     Comment:  years ago No date: Multiple allergies Past Surgical History:   ?: EYE SURGERY No date: NASAL SEPTUM SURGERY BMI    Body Mass Index: 27.12 kg/m     Reproductive/Obstetrics negative OB ROS                             Anesthesia Physical Anesthesia Plan  ASA: II  Anesthesia Plan: General   Post-op Pain Management:    Induction:   PONV Risk Score and Plan:   Airway Management Planned:   Additional Equipment:   Intra-op Plan:   Post-operative Plan:   Informed Consent: I have reviewed the patients History and Physical, chart, labs and discussed the procedure including the risks, benefits and alternatives for the proposed anesthesia with the patient or authorized representative who has indicated his/her understanding and acceptance.     Dental Advisory Given  Plan Discussed with: CRNA  Anesthesia  Plan Comments:         Anesthesia Quick Evaluation

## 2020-04-14 NOTE — Progress Notes (Signed)
   04/14/20 0750  Clinical Encounter Type  Visited With Family  Visit Type Initial  Referral From Chaplain  Consult/Referral To Chaplain  While rounding [facility] waiting area, chaplain briefly spoke with patient's husband. He said he was doing fine. Chaplain wished him well and left.

## 2020-04-14 NOTE — Anesthesia Procedure Notes (Signed)
Procedure Name: General with mask airway Performed by: Fletcher-Harrison, Lawrence Roldan, CRNA Pre-anesthesia Checklist: Patient identified, Emergency Drugs available, Suction available and Patient being monitored Patient Re-evaluated:Patient Re-evaluated prior to induction Oxygen Delivery Method: Simple face mask Induction Type: IV induction Placement Confirmation: positive ETCO2 and CO2 detector Dental Injury: Teeth and Oropharynx as per pre-operative assessment        

## 2020-04-14 NOTE — H&P (Signed)
Wyline Mood, MD [address], [place], [place], [address] [address], [address], [place], [place], [address] Phone: [phone]  Fax: [phone]  Primary Care Physician:  Lorre Munroe, NP   Pre-Procedure History & Physical: HPI:  AIDAH FORQUER is a 49 y.o. female is here for an colonoscopy.   Past Medical History:  Diagnosis Date  . Heart murmur    no proph  . History of asthma    in the past, very mild  . History of Bell's palsy    years ago  . Multiple allergies     Past Surgical History:  Procedure Laterality Date  . EYE SURGERY    ?  . NASAL SEPTUM SURGERY      Prior to Admission medications   Medication Sig Start Date End Date Taking? Authorizing Provider  calcium-vitamin D (OSCAL WITH D) 500-200 MG-UNIT tablet Take 1 tablet by mouth.   Yes [provider]  fluticasone (FLONASE) 50 MCG/ACT nasal spray 2 sprays each nostril once daily 2 sprays each nostril once daily 02/18/14  Yes Dianne Dun, MD  Magnesium Citrate 200 MG TABS Take by mouth.   Yes [provider]  traZODone (DESYREL) 50 MG tablet TAKE 1/2 TO 1 TABLET(25 TO 50 MG) BY MOUTH AT BEDTIME AS NEEDED FOR SLEEP 09/25/19  Yes Dianne Dun, MD  venlafaxine XR (EFFEXOR-XR) 75 MG 24 hr capsule Take 1 capsule (75 mg total) by mouth daily with breakfast. 02/08/20  Yes Lorre Munroe, NP    Allergies as of 02/15/2020 - Review Complete 02/15/2020  Allergen Reaction Noted  . Sulfonamide derivatives  02/23/2007    Family History  Problem Relation Age of Onset  . Arthritis Mother        rheumatoid  . Hypertension Mother   . COPD Mother   . Hypertension Father   . COPD Father   . Diabetes Maternal Grandmother   . Asthma Maternal Grandmother   . Arthritis Maternal Grandfather        rheumatoid  . Hyperlipidemia Maternal Grandfather   . Hypertension Maternal Grandfather   . Arthritis Paternal Grandmother   . Breast cancer Maternal Aunt 40    Social History    Socioeconomic History  . Marital status: Married    Spouse name: Not on file  . Number of children: 2  . Years of education: Not on file  . Highest education level: Not on file  Occupational History  . Occupation: Event organiser: [employer]  Tobacco Use  . Smoking status: Never Smoker  . Smokeless tobacco: Never Used  Vaping Use  . Vaping Use: Never used  Substance and Sexual Activity  . Alcohol use: Yes  . Drug use: No  . Sexual activity: Not on file  Other Topics Concern  . Not on file  Social History Narrative   No regular exercise.   Social Determinants of Health   Financial Resource Strain:   . Difficulty of Paying Living Expenses: Not on file  Food Insecurity:   . Worried About Programme researcher, broadcasting/film/video in the Last Year: Not on file  . Ran Out of Food in the Last Year: Not on file  Transportation Needs:   . Lack of Transportation (Medical): Not on file  . Lack of Transportation (Non-Medical): Not on file  Physical Activity:   . Days of Exercise per Week: Not on file  . Minutes of Exercise per Session: Not on file  Stress:   . Feeling of Stress : Not on file  Social Connections:   . Frequency of Communication with Friends and Family: Not on file  . Frequency of Social Gatherings with Friends and Family: Not on file  . Attends Religious Services: Not on file  . Active Member of Clubs or Organizations: Not on file  . Attends Banker Meetings: Not on file  . Marital Status: Not on file  Intimate Partner Violence:   . Fear of Current or Ex-Partner: Not on file  . Emotionally Abused: Not on file  . Physically Abused: Not on file  . Sexually Abused: Not on file    Review of Systems: See HPI, otherwise negative ROS  Physical Exam: BP (!) 127/95   Pulse 74   Temp (!) 96 F (35.6 C) (Temporal)   Resp 18   Ht 5\' 4"  (1.626 m)   Wt 71.7 kg   SpO2 100%   BMI 27.12 kg/m  General:   Alert,  pleasant and cooperative in NAD Head:  Normocephalic and  atraumatic. Neck:  Supple; no masses or thyromegaly. Lungs:  Clear throughout to auscultation, normal respiratory effort.    Heart:  +S1, +S2, Regular rate and rhythm, No edema. Abdomen:  Soft, nontender and nondistended. Normal bowel sounds, without guarding, and without rebound.   Neurologic:  Alert and  oriented x4;  grossly normal neurologically.  Impression/Plan: DOYCE STONEHOUSE is here for an colonoscopy to be performed for Screening colonoscopy average risk   Risks, benefits, limitations, and alternatives regarding  colonoscopy have been reviewed with the patient.  Questions have been answered.  All parties agreeable.   Arlyss Gandy, MD  04/14/2020, 8:13 AM

## 2020-04-16 ENCOUNTER — Encounter: Payer: Self-pay | Admitting: Gastroenterology

## 2020-05-01 ENCOUNTER — Encounter: Payer: Self-pay | Admitting: Internal Medicine

## 2020-05-02 MED ORDER — TRAZODONE HCL 50 MG PO TABS
ORAL_TABLET | ORAL | 1 refills | Status: DC
Start: 2020-05-02 — End: 2020-10-28

## 2020-06-24 ENCOUNTER — Other Ambulatory Visit: Payer: Self-pay | Admitting: Internal Medicine

## 2020-06-24 DIAGNOSIS — Z1231 Encounter for screening mammogram for malignant neoplasm of breast: Secondary | ICD-10-CM

## 2020-07-31 ENCOUNTER — Ambulatory Visit
Admission: RE | Admit: 2020-07-31 | Discharge: 2020-07-31 | Disposition: A | Discharge status: Home / Self Care | Payer: Managed Care, Other (non HMO) | Source: Ambulatory Visit | Attending: Internal Medicine | Admitting: Internal Medicine

## 2020-07-31 ENCOUNTER — Other Ambulatory Visit: Payer: Self-pay

## 2020-07-31 DIAGNOSIS — Z1231 Encounter for screening mammogram for malignant neoplasm of breast: Secondary | ICD-10-CM | POA: Diagnosis not present

## 2020-08-07 DIAGNOSIS — S76019A Strain of muscle, fascia and tendon of unspecified hip, initial encounter: Secondary | ICD-10-CM | POA: Insufficient documentation

## 2020-10-28 ENCOUNTER — Other Ambulatory Visit: Payer: Self-pay | Admitting: Internal Medicine

## 2020-11-05 ENCOUNTER — Encounter: Payer: Self-pay | Admitting: Internal Medicine

## 2021-01-27 ENCOUNTER — Encounter: Payer: Self-pay | Admitting: Internal Medicine

## 2021-01-27 DIAGNOSIS — F419 Anxiety disorder, unspecified: Secondary | ICD-10-CM

## 2021-01-27 MED ORDER — VENLAFAXINE HCL ER 75 MG PO CP24: 75.0000 mg | ORAL_CAPSULE | Freq: Every day | ORAL | 1 refills | Status: DC

## 2021-02-09 ENCOUNTER — Encounter: Payer: Self-pay | Admitting: Internal Medicine

## 2021-02-09 ENCOUNTER — Other Ambulatory Visit: Payer: Self-pay

## 2021-02-09 ENCOUNTER — Ambulatory Visit (INDEPENDENT_AMBULATORY_CARE_PROVIDER_SITE_OTHER): Payer: Managed Care, Other (non HMO) | Admitting: Internal Medicine

## 2021-02-09 VITALS — BP 116/62 | HR 78 | Temp 98.2°F | Resp 17 | Ht 63.5 in | Wt 158.8 lb

## 2021-02-09 DIAGNOSIS — D500 Iron deficiency anemia secondary to blood loss (chronic): Secondary | ICD-10-CM | POA: Diagnosis not present

## 2021-02-09 DIAGNOSIS — F419 Anxiety disorder, unspecified: Secondary | ICD-10-CM | POA: Diagnosis not present

## 2021-02-09 DIAGNOSIS — F5101 Primary insomnia: Secondary | ICD-10-CM | POA: Diagnosis not present

## 2021-02-09 DIAGNOSIS — Z8261 Family history of arthritis: Secondary | ICD-10-CM | POA: Diagnosis not present

## 2021-02-09 DIAGNOSIS — Z6829 Body mass index (BMI) 29.0-29.9, adult: Secondary | ICD-10-CM | POA: Insufficient documentation

## 2021-02-09 DIAGNOSIS — Z1159 Encounter for screening for other viral diseases: Secondary | ICD-10-CM

## 2021-02-09 DIAGNOSIS — E6609 Other obesity due to excess calories: Secondary | ICD-10-CM | POA: Insufficient documentation

## 2021-02-09 DIAGNOSIS — E663 Overweight: Secondary | ICD-10-CM | POA: Insufficient documentation

## 2021-02-09 DIAGNOSIS — Z114 Encounter for screening for human immunodeficiency virus [HIV]: Secondary | ICD-10-CM | POA: Diagnosis not present

## 2021-02-09 DIAGNOSIS — Z0001 Encounter for general adult medical examination with abnormal findings: Secondary | ICD-10-CM | POA: Diagnosis not present

## 2021-02-09 DIAGNOSIS — Z6826 Body mass index (BMI) 26.0-26.9, adult: Secondary | ICD-10-CM | POA: Insufficient documentation

## 2021-02-09 MED ORDER — VENLAFAXINE HCL ER 75 MG PO CP24: 75.0000 mg | ORAL_CAPSULE | Freq: Every day | ORAL | 3 refills | Status: DC

## 2021-02-09 MED ORDER — TRAZODONE HCL 50 MG PO TABS: 50.0000 mg | ORAL_TABLET | Freq: Every day | ORAL | 3 refills | Status: DC

## 2021-02-09 NOTE — Assessment & Plan Note (Signed)
Stable on her current dose of Venlafaxine, she is not interested in weaning this medication today Support offered

## 2021-02-09 NOTE — Assessment & Plan Note (Signed)
Stable on Trazodone, refilled today

## 2021-02-09 NOTE — Patient Instructions (Signed)

## 2021-02-09 NOTE — Assessment & Plan Note (Signed)
Encourage low-carb diet and exercise weight loss 

## 2021-02-09 NOTE — Progress Notes (Signed)
Subjective:    Patient ID: Maria Thornton, female    DOB: Aug 26, 1970, 50 y.o.   MRN: [id number]  HPI  Pt presents to the clinic today for her annual exam. She is also due to follow up chronic conditions.  Anemia: Her last H/H was 15.1/44.6, 01/2020. She is not taking any oral iron OTC. She does not follow with hematology.  Anxiety: Persistent, managed on Venalafaxine. She is not currently seeing a therapist. She denies depression, SI/HI.  Insomnia: She has difficulty staying asleep. She takes Trazadone with good results. There is no sleep study on file.   Flu: 05/2019 Tetanus: 01/2020 Covid: [address] x2 Pap Smear: 03/2018 Mammogram: 07/2020 Colon Screening: 03/2020, 3 years Vision Screening: annually Dentist: biannually  Diet: She does eat meat. She consumes more veggies than fruits. She does not eat fried foods. She drinks mostly water. Exercise: None  Review of Systems     Past Medical History:  Diagnosis Date   Heart murmur    no proph   History of asthma    in the past, very mild   History of Bell's palsy    years ago   Multiple allergies     Current Outpatient Medications  Medication Sig Dispense Refill   calcium-vitamin D (OSCAL WITH D) 500-200 MG-UNIT tablet Take 1 tablet by mouth.     fluticasone (FLONASE) 50 MCG/ACT nasal spray 2 sprays each nostril once daily 2 sprays each nostril once daily 16 g 3   Magnesium Citrate 200 MG TABS Take by mouth.     traZODone (DESYREL) 50 MG tablet TAKE 1/2 TO 1 TABLET(25 TO 50 MG) BY MOUTH AT BEDTIME AS NEEDED FOR SLEEP 90 tablet 1   venlafaxine XR (EFFEXOR-XR) 75 MG 24 hr capsule Take 1 capsule (75 mg total) by mouth daily with breakfast. 90 capsule 1   No current facility-administered medications for this visit.    Allergies  Allergen Reactions   Sulfonamide Derivatives     REACTION: rash    Family History  Problem Relation Age of Onset   Arthritis Mother        rheumatoid   Hypertension Mother    COPD Mother     Hypertension Father    COPD Father    Diabetes Maternal Grandmother    Asthma Maternal Grandmother    Arthritis Maternal Grandfather        rheumatoid   Hyperlipidemia Maternal Grandfather    Hypertension Maternal Grandfather    Arthritis Paternal Grandmother    Breast cancer Maternal Aunt 53       Invasive Lobular Carcinoma     Social History   Socioeconomic History   Marital status: Married    Spouse name: Not on file   Number of children: 2   Years of education: Not on file   Highest education level: Not on file  Occupational History   Occupation: Best boy: [employer]  Tobacco Use   Smoking status: Never   Smokeless tobacco: Never  Vaping Use   Vaping Use: Never used  Substance and Sexual Activity   Alcohol use: Yes   Drug use: No   Sexual activity: Not on file  Other Topics Concern   Not on file  Social History Narrative   No regular exercise.   Social Determinants of Health   Financial Resource Strain: Not on file  Food Insecurity: Not on file  Transportation Needs: Not on file  Physical Activity: Not on file  Stress: Not  on file  Social Connections: Not on file  Intimate Partner Violence: Not on file     Constitutional: Denies fever, malaise, fatigue, headache or abrupt weight changes.  HEENT: Denies eye pain, eye redness, ear pain, ringing in the ears, wax buildup, runny nose, nasal congestion, bloody nose, or sore throat. Respiratory: Denies difficulty breathing, shortness of breath, cough or sputum production.   Cardiovascular: Denies chest pain, chest tightness, palpitations or swelling in the hands or feet.  Gastrointestinal: Denies abdominal pain, bloating, constipation, diarrhea or blood in the stool.  GU: Denies urgency, frequency, pain with urination, burning sensation, blood in urine, odor or discharge. Musculoskeletal: Pt reports intermittent hip pain. Denies decrease in range of motion, difficulty with gait, muscle pain or joint  swelling.  Skin: Denies redness, rashes, lesions or ulcercations.  Neurological: Pt reports insomnia. Denies dizziness, difficulty with memory, difficulty with speech or problems with balance and coordination.  Psych: Pt has a history of anxiety. Denies depression, SI/HI.  No other specific complaints in a complete review of systems (except as listed in HPI above).  Objective:   Physical Exam  BP 116/62 (BP Location: Left Arm, Patient Position: Sitting, Cuff Size: Normal)   Pulse 78   Temp 98.2 F (36.8 C) (Temporal)   Resp 17   Ht 5' 2.5" (1.588 m)   Wt 158 lb 12.8 oz (72 kg)   SpO2 100%   BMI 28.58 kg/m   Wt Readings from Last 3 Encounters:  04/14/20 158 lb (71.7 kg)  02/05/20 159 lb (72.1 kg)  12/27/19 158 lb (71.7 kg)    General: Appears her stated age, overweight, in NAD. Skin: Warm, dry and intact. No rashes noted. HEENT: Head: normal shape and size; Eyes: sclera white and EOMs intact;  Neck:  Neck supple, trachea midline. No masses, lumps or thyromegaly present.  Cardiovascular: Normal rate and rhythm. S1,S2 noted.  No murmur, rubs or gallops noted. No JVD or BLE edema. No carotid bruits noted. Pulmonary/Chest: Normal effort and positive vesicular breath sounds. No respiratory distress. No wheezes, rales or ronchi noted.  Abdomen: Soft and nontender. Normal bowel sounds. No distention or masses noted. Liver, spleen and kidneys non palpable. Musculoskeletal: Strength 5/5 BUE/BLE.  No difficulty with gait.  Neurological: Alert and oriented. Cranial nerves II-XII grossly intact. Coordination normal.  Psychiatric: Mood and affect normal. Behavior is normal. Judgment and thought content normal.    BMET    Component Value Date/Time   NA 141 02/05/2020 0938   K 4.6 02/05/2020 0938   CL 102 02/05/2020 0938   CO2 24 02/05/2020 0938   GLUCOSE 94 02/05/2020 0938   BUN 13 02/05/2020 0938   CREATININE 0.87 02/05/2020 0938   CALCIUM 10.3 (H) 02/05/2020 0938   GFRNONAA 78  02/05/2020 0938   GFRAA 90 02/05/2020 0938    Lipid Panel     Component Value Date/Time   CHOL 193 02/05/2020 0938   TRIG 43 02/05/2020 0938   HDL 70 02/05/2020 0938   CHOLHDL 2.8 02/05/2020 0938   LDLCALC 115 (H) 02/05/2020 0938    CBC    Component Value Date/Time   WBC 4.2 02/05/2020 0938   RBC 4.86 02/05/2020 0938   HGB 15.1 02/05/2020 0938   HCT 44.6 02/05/2020 0938   PLT 217 02/05/2020 0938   MCV 92 02/05/2020 0938   MCH 31.1 02/05/2020 0938   MCHC 33.9 02/05/2020 0938   RDW 12.9 02/05/2020 0938   LYMPHSABS 1.7 04/01/2016 0000   EOSABS 0.3 04/01/2016  0000   BASOSABS 0.1 04/01/2016 0000    Hgb A1C Lab Results  Component Value Date   HGBA1C 5.3 02/05/2020           Assessment & Plan:   Preventative Health Maintenance:  Encouraged her to get her flu shot in the fall Tetanus UTD Encouraged her to get her COVID booster Pap smear due 2024 Mammogram due 07/2021 Colon screening UTD Encouraged her to consume a balanced diet and exercise regimen Advised her to see an eye doctor and dentist annually We will check CBC, c-Met, lipid, A1c, HIV and hep C today  Family history of RA:  Rheumatoid factor and ANA ordered today  RTC in 1 year, sooner if needed  Webb Silversmith, NP This visit occurred during the SARS-CoV-2 public health emergency.  Safety protocols were in place, including screening questions prior to the visit, additional usage of staff PPE, and extensive cleaning of exam room while observing appropriate contact time as indicated for disinfecting solutions.

## 2021-02-09 NOTE — Assessment & Plan Note (Signed)
CBC today.  

## 2021-02-10 ENCOUNTER — Encounter: Payer: Self-pay | Admitting: Internal Medicine

## 2021-02-10 LAB — COMPREHENSIVE METABOLIC PANEL
ALT: 30 IU/L (ref 0–32)
AST: 22 IU/L (ref 0–40)
Albumin/Globulin Ratio: 2.2 (ref 1.2–2.2)
Albumin: 5 g/dL — ABNORMAL HIGH (ref 3.8–4.8)
Alkaline Phosphatase: 72 IU/L (ref 44–121)
BUN/Creatinine Ratio: 22 (ref 9–23)
BUN: 19 mg/dL (ref 6–24)
Bilirubin Total: 0.5 mg/dL (ref 0.0–1.2)
CO2: 26 mmol/L (ref 20–29)
Calcium: 10.3 mg/dL — ABNORMAL HIGH (ref 8.7–10.2)
Chloride: 101 mmol/L (ref 96–106)
Creatinine, Ser: 0.86 mg/dL (ref 0.57–1.00)
Globulin, Total: 2.3 g/dL (ref 1.5–4.5)
Glucose: 106 mg/dL — ABNORMAL HIGH (ref 65–99)
Potassium: 4.8 mmol/L (ref 3.5–5.2)
Sodium: 138 mmol/L (ref 134–144)
Total Protein: 7.3 g/dL (ref 6.0–8.5)
eGFR: 82 mL/min/{1.73_m2} (ref >59)

## 2021-02-10 LAB — HEPATITIS C ANTIBODY: Hep C Virus Ab: <0.1 s/co ratio (ref 0.0–0.9)

## 2021-02-10 LAB — CBC
Hematocrit: 41.3 % (ref 34.0–46.6)
Hemoglobin: 14.2 g/dL (ref 11.1–15.9)
MCH: 31.6 pg (ref 26.6–33.0)
MCHC: 34.4 g/dL (ref 31.5–35.7)
MCV: 92 fL (ref 79–97)
Platelets: 185 10*3/uL (ref 150–450)
RBC: 4.5 x10E6/uL (ref 3.77–5.28)
RDW: 12.6 % (ref 11.7–15.4)
WBC: 4 10*3/uL (ref 3.4–10.8)

## 2021-02-10 LAB — LIPID PANEL
Chol/HDL Ratio: 2.9 ratio (ref 0.0–4.4)
Cholesterol, Total: 204 mg/dL — ABNORMAL HIGH (ref 100–199)
HDL: 70 mg/dL (ref >39)
LDL Chol Calc (NIH): 125 mg/dL — ABNORMAL HIGH (ref 0–99)
Triglycerides: 47 mg/dL (ref 0–149)
VLDL Cholesterol Cal: 9 mg/dL (ref 5–40)

## 2021-02-10 LAB — HEMOGLOBIN A1C
Est. average glucose Bld gHb Est-mCnc: 105 mg/dL
Hgb A1c MFr Bld: 5.3 % (ref 4.8–5.6)

## 2021-02-10 LAB — HIV ANTIBODY (ROUTINE TESTING W REFLEX): HIV Screen 4th Generation wRfx: NONREACTIVE

## 2021-02-10 LAB — RHEUMATOID FACTOR: Rheumatoid fact SerPl-aCnc: <10 IU/mL (ref <14.0)

## 2021-02-11 ENCOUNTER — Other Ambulatory Visit: Payer: Self-pay

## 2021-02-12 LAB — TSH: TSH: 1.14 u[IU]/mL (ref 0.450–4.500)

## 2021-02-12 LAB — PTH, INTACT AND CALCIUM
Calcium: 9.8 mg/dL (ref 8.7–10.2)
PTH: 30 pg/mL (ref 15–65)

## 2021-02-18 ENCOUNTER — Encounter: Payer: Self-pay | Admitting: Internal Medicine

## 2021-04-15 ENCOUNTER — Other Ambulatory Visit: Payer: Self-pay | Admitting: Internal Medicine

## 2021-04-27 ENCOUNTER — Encounter: Payer: Self-pay | Admitting: Internal Medicine

## 2021-06-19 ENCOUNTER — Encounter: Payer: Self-pay | Admitting: Internal Medicine

## 2021-06-22 ENCOUNTER — Other Ambulatory Visit: Payer: Self-pay | Admitting: Internal Medicine

## 2021-06-22 DIAGNOSIS — Z1231 Encounter for screening mammogram for malignant neoplasm of breast: Secondary | ICD-10-CM

## 2021-08-03 ENCOUNTER — Ambulatory Visit
Admission: RE | Admit: 2021-08-03 | Discharge: 2021-08-03 | Disposition: A | Discharge status: Home / Self Care | Payer: Managed Care, Other (non HMO) | Source: Ambulatory Visit | Attending: Internal Medicine | Admitting: Internal Medicine

## 2021-08-03 ENCOUNTER — Other Ambulatory Visit: Payer: Self-pay

## 2021-08-03 DIAGNOSIS — Z1231 Encounter for screening mammogram for malignant neoplasm of breast: Secondary | ICD-10-CM

## 2022-01-21 ENCOUNTER — Other Ambulatory Visit: Payer: Self-pay | Admitting: Internal Medicine

## 2022-01-21 NOTE — Telephone Encounter (Signed)
Pt has appt 02/11/22 [facility] refill #[id number] Requested Prescriptions  Pending Prescriptions Disp Refills  . traZODone (DESYREL) 50 MG tablet [Pharmacy Med Name: TRAZODONE 50MG  TABLETS] 30 tablet 0    Sig: TAKE 1/2 TO 1 TABLET BY MOUTH AT BEDTIME AS NEEDED FOR SLEEP     Psychiatry: Antidepressants - Serotonin Modulator Failed - 01/21/2022  7:08 AM      Failed - Valid encounter within last 6 months    Recent Outpatient Visits          11 months ago Encounter for general adult medical examination with abnormal findings   [facility], Coralie Keens, NP      Future Appointments            In 3 weeks Garnette Gunner, Coralie Keens, NP [facility], [facility]

## 2022-02-11 ENCOUNTER — Ambulatory Visit (INDEPENDENT_AMBULATORY_CARE_PROVIDER_SITE_OTHER): Payer: Managed Care, Other (non HMO) | Admitting: Internal Medicine

## 2022-02-11 ENCOUNTER — Encounter: Payer: Self-pay | Admitting: Internal Medicine

## 2022-02-11 VITALS — BP 134/88 | HR 81 | Temp 96.9°F | Ht 62.5 in | Wt 173.0 lb

## 2022-02-11 DIAGNOSIS — F419 Anxiety disorder, unspecified: Secondary | ICD-10-CM

## 2022-02-11 DIAGNOSIS — Z1231 Encounter for screening mammogram for malignant neoplasm of breast: Secondary | ICD-10-CM

## 2022-02-11 DIAGNOSIS — E7800 Pure hypercholesterolemia, unspecified: Secondary | ICD-10-CM | POA: Insufficient documentation

## 2022-02-11 DIAGNOSIS — Z6831 Body mass index (BMI) 31.0-31.9, adult: Secondary | ICD-10-CM | POA: Diagnosis not present

## 2022-02-11 DIAGNOSIS — E6609 Other obesity due to excess calories: Secondary | ICD-10-CM | POA: Diagnosis not present

## 2022-02-11 DIAGNOSIS — Z0001 Encounter for general adult medical examination with abnormal findings: Secondary | ICD-10-CM | POA: Diagnosis not present

## 2022-02-11 DIAGNOSIS — F5101 Primary insomnia: Secondary | ICD-10-CM

## 2022-02-11 MED ORDER — VENLAFAXINE HCL ER 75 MG PO CP24: 75.0000 mg | ORAL_CAPSULE | Freq: Every day | ORAL | 3 refills | Status: DC

## 2022-02-11 MED ORDER — TRAZODONE HCL 50 MG PO TABS: 50.0000 mg | ORAL_TABLET | Freq: Every evening | ORAL | 3 refills | Status: DC | PRN

## 2022-02-11 NOTE — Assessment & Plan Note (Signed)
Continue Trazodone, refilled today We will monitor

## 2022-02-11 NOTE — Progress Notes (Signed)
Subjective:    Patient ID: Maria Thornton, female    DOB: June 24, 1971, 51 y.o.   MRN: [id number]  HPI  Patient presents to clinic today for her annual exam.  She is also due to follow-up chronic conditions.  Anxiety: Persistent, managed on Venlafaxine.  She is not currently seeing a therapist.  She denies depression, SI/HI.  Insomnia: She has difficulty staying asleep.  She takes Trazodone as prescribed with good results.  There is no sleep study on file.  HLD: Her last LDL was 125, triglycerides 47, 01/2021.  She is not taking any cholesterol-lowering medication at this time.  She tries to consume a low-fat diet.  Flu: 05/2021 Tetanus: 01/2020 COVID: Pf[employer] x4 Shingrix: x 1 at [facility] Pap smear: 03/2018 Mammogram: 07/2021 Colon screening: 03/2020 Vision screening:annually Dentist: biannually  Diet: She does eat meat. She consumes more veggies than fruits. She tries to avoid fried foods. She drinks mostly water, Energy drink Exercise: rowing and treadmill 3 days per week  Review of Systems     Past Medical History:  Diagnosis Date   Heart murmur    no proph   History of asthma    in the past, very mild   History of Bell's palsy    years ago   Multiple allergies     Current Outpatient Medications  Medication Sig Dispense Refill   Ascorbic Acid (VITAMIN C) 1000 MG tablet      fluticasone (FLONASE) 50 MCG/ACT nasal spray 2 sprays each nostril once daily 2 sprays each nostril once daily 16 g 3   Magnesium Citrate 200 MG TABS Take by mouth.     traZODone (DESYREL) 50 MG tablet TAKE 1/2 TO 1 TABLET BY MOUTH AT BEDTIME AS NEEDED FOR SLEEP 30 tablet 0   venlafaxine XR (EFFEXOR-XR) 75 MG 24 hr capsule Take 1 capsule (75 mg total) by mouth daily with breakfast. 90 capsule 3   No current facility-administered medications for this visit.    Allergies  Allergen Reactions   Sulfonamide Derivatives     REACTION: rash    Family History  Problem Relation Age of Onset    Arthritis Mother        rheumatoid   Hypertension Mother    COPD Mother    Hypertension Father    COPD Father    Diabetes Maternal Grandmother    Asthma Maternal Grandmother    Arthritis Maternal Grandfather        rheumatoid   Hyperlipidemia Maternal Grandfather    Hypertension Maternal Grandfather    Arthritis Paternal Grandmother    Breast cancer Maternal Aunt 53       Invasive Lobular Carcinoma     Social History   Socioeconomic History   Marital status: Married    Spouse name: Not on file   Number of children: 2   Years of education: Not on file   Highest education level: Not on file  Occupational History   Occupation: Best boy: [employer]  Tobacco Use   Smoking status: Never   Smokeless tobacco: Never  Vaping Use   Vaping Use: Never used  Substance and Sexual Activity   Alcohol use: Yes   Drug use: No   Sexual activity: Not on file  Other Topics Concern   Not on file  Social History Narrative   No regular exercise.   Social Determinants of Health   Financial Resource Strain: Not on file  Food Insecurity: Not on file  Transportation  Needs: Not on file  Physical Activity: Not on file  Stress: Not on file  Social Connections: Not on file  Intimate Partner Violence: Not on file     Constitutional: Patient reports difficulty losing weight.  Denies fever, malaise, fatigue, headache.  HEENT: Denies eye pain, eye redness, ear pain, ringing in the ears, wax buildup, runny nose, nasal congestion, bloody nose, or sore throat. Respiratory: Denies difficulty breathing, shortness of breath, cough or sputum production.   Cardiovascular: Denies chest pain, chest tightness, palpitations or swelling in the hands or feet.  Gastrointestinal: Denies abdominal pain, bloating, constipation, diarrhea or blood in the stool.  GU: Denies urgency, frequency, pain with urination, burning sensation, blood in urine, odor or discharge. Musculoskeletal: Patient reports  intermittent back pain.  Denies decrease in range of motion, difficulty with gait, or joint swelling.  Skin: Denies redness, rashes, lesions or ulcercations.  Neurological: Patient reports insomnia.  Denies dizziness, difficulty with memory, difficulty with speech or problems with balance and coordination.  Psych: Patient has a history of anxiety.  Denies depression, SI/HI.  No other specific complaints in a complete review of systems (except as listed in HPI above).  Objective:   Physical Exam  BP 134/88 (BP Location: Left Arm, Patient Position: Sitting, Cuff Size: Normal)   Pulse 81   Temp (!) 96.9 F (36.1 C) (Temporal)   Ht 5' 2.5" (1.588 m)   Wt 173 lb (78.5 kg)   SpO2 99%   BMI 31.14 kg/m   Wt Readings from Last 3 Encounters:  02/09/21 158 lb 12.8 oz (72 kg)  04/14/20 158 lb (71.7 kg)  02/05/20 159 lb (72.1 kg)    General: Appears her stated age, obese, in NAD. Skin: Warm, dry and intact.  HEENT: Head: normal shape and size; Eyes: sclera white, no icterus, conjunctiva pink, PERRLA and EOMs intact;  We will neck:  Neck supple, trachea midline. No masses, lumps or thyromegaly present.  Cardiovascular: Normal rate and rhythm. S1,S2 noted.  No murmur, rubs or gallops noted. No JVD or BLE edema. No carotid bruits noted. Pulmonary/Chest: Normal effort and positive vesicular breath sounds. No respiratory distress. No wheezes, rales or ronchi noted.  Abdomen:  Normal bowel sounds.  Musculoskeletal: Strength 5/5 BUE/BLE.  No difficulty with gait.  Neurological: Alert and oriented. Cranial nerves II-XII grossly intact. Coordination normal.  Psychiatric: Mood and affect normal. Behavior is normal. Judgment and thought content normal.     BMET    Component Value Date/Time   NA 138 02/09/2021 0917   K 4.8 02/09/2021 0917   CL 101 02/09/2021 0917   CO2 26 02/09/2021 0917   GLUCOSE 106 (H) 02/09/2021 0917   BUN 19 02/09/2021 0917   CREATININE 0.86 02/09/2021 0917   CALCIUM 9.8  02/11/2021 1101   GFRNONAA 78 02/05/2020 0938   GFRAA 90 02/05/2020 0938    Lipid Panel     Component Value Date/Time   CHOL 204 (H) 02/09/2021 0917   TRIG 47 02/09/2021 0917   HDL 70 02/09/2021 0917   CHOLHDL 2.9 02/09/2021 0917   LDLCALC 125 (H) 02/09/2021 0917    CBC    Component Value Date/Time   WBC 4.0 02/09/2021 0917   RBC 4.50 02/09/2021 0917   HGB 14.2 02/09/2021 0917   HCT 41.3 02/09/2021 0917   PLT 185 02/09/2021 0917   MCV 92 02/09/2021 0917   MCH 31.6 02/09/2021 0917   MCHC 34.4 02/09/2021 0917   RDW 12.6 02/09/2021 0917   LYMPHSABS  1.7 04/01/2016 0000   EOSABS 0.3 04/01/2016 0000   BASOSABS 0.1 04/01/2016 0000    Hgb A1C Lab Results  Component Value Date   HGBA1C 5.3 02/09/2021           Assessment & Plan:   Preventative Health Maintenance:  Encouraged her to get a flu shot in the fall Tetanus UTD Encouraged her to get her COVID booster Pap smear UTD Mammogram ordered-she will call to schedule Colon screening UTD Encouraged her to consume a balanced diet and exercise regimen Advised her to see an eye doctor and dentist annually We will check c-Met today.  She will bring me a copy of her biometric screening results from [place] in 1 year for your annual exam and follow-up of chronic conditions Webb Silversmith, NP

## 2022-02-11 NOTE — Assessment & Plan Note (Signed)
She has had her lipids checked at her biometric screening today, she will send me a copy of these results Encourage low-fat diet

## 2022-02-11 NOTE — Patient Instructions (Signed)
Health Maintenance for Postmenopausal Women Menopause is a normal process in which your ability to get pregnant comes to an end. This process happens slowly over many months or years, usually between the ages of 48 and 55. Menopause is complete when you have missed your menstrual period for 12 months. It is important to talk with your health care provider about some of the most common conditions that affect women after menopause (postmenopausal women). These include heart disease, cancer, and bone loss (osteoporosis). Adopting a healthy lifestyle and getting preventive care can help to promote your health and wellness. The actions you take can also lower your chances of developing some of these common conditions. What are the signs and symptoms of menopause? During menopause, you may have the following symptoms: Hot flashes. These can be moderate or severe. Night sweats. Decrease in sex drive. Mood swings. Headaches. Tiredness (fatigue). Irritability. Memory problems. Problems falling asleep or staying asleep. Talk with your health care provider about treatment options for your symptoms. Do I need hormone replacement therapy? Hormone replacement therapy is effective in treating symptoms that are caused by menopause, such as hot flashes and night sweats. Hormone replacement carries certain risks, especially as you become older. If you are thinking about using estrogen or estrogen with progestin, discuss the benefits and risks with your health care provider. How can I reduce my risk for heart disease and stroke? The risk of heart disease, heart attack, and stroke increases as you age. One of the causes may be a change in the body's hormones during menopause. This can affect how your body uses dietary fats, triglycerides, and cholesterol. Heart attack and stroke are medical emergencies. There are many things that you can do to help prevent heart disease and stroke. Watch your blood pressure High  blood pressure causes heart disease and increases the risk of stroke. This is more likely to develop in people who have high blood pressure readings or are overweight. Have your blood pressure checked: Every 3-5 years if you are 18-39 years of age. Every year if you are 40 years old or older. Eat a healthy diet  Eat a diet that includes plenty of vegetables, fruits, low-fat dairy products, and lean protein. Do not eat a lot of foods that are high in solid fats, added sugars, or sodium. Get regular exercise Get regular exercise. This is one of the most important things you can do for your health. Most adults should: Try to exercise for at least 150 minutes each week. The exercise should increase your heart rate and make you sweat (moderate-intensity exercise). Try to do strengthening exercises at least twice each week. Do these in addition to the moderate-intensity exercise. Spend less time sitting. Even light physical activity can be beneficial. Other tips Work with your health care provider to achieve or maintain a healthy weight. Do not use any products that contain nicotine or tobacco. These products include cigarettes, chewing tobacco, and vaping devices, such as e-cigarettes. If you need help quitting, ask your health care provider. Know your numbers. Ask your health care provider to check your cholesterol and your blood sugar (glucose). Continue to have your blood tested as directed by your health care provider. Do I need screening for cancer? Depending on your health history and family history, you may need to have cancer screenings at different stages of your life. This may include screening for: Breast cancer. Cervical cancer. Lung cancer. Colorectal cancer. What is my risk for osteoporosis? After menopause, you may be   at increased risk for osteoporosis. Osteoporosis is a condition in which bone destruction happens more quickly than new bone creation. To help prevent osteoporosis or  the bone fractures that can happen because of osteoporosis, you may take the following actions: If you are 19-50 years old, get at least 1,000 mg of calcium and at least 600 international units (IU) of vitamin D per day. If you are older than age 50 but younger than age 70, get at least 1,200 mg of calcium and at least 600 international units (IU) of vitamin D per day. If you are older than age 70, get at least 1,200 mg of calcium and at least 800 international units (IU) of vitamin D per day. Smoking and drinking excessive alcohol increase the risk of osteoporosis. Eat foods that are rich in calcium and vitamin D, and do weight-bearing exercises several times each week as directed by your health care provider. How does menopause affect my mental health? Depression may occur at any age, but it is more common as you become older. Common symptoms of depression include: Feeling depressed. Changes in sleep patterns. Changes in appetite or eating patterns. Feeling an overall lack of motivation or enjoyment of activities that you previously enjoyed. Frequent crying spells. Talk with your health care provider if you think that you are experiencing any of these symptoms. General instructions See your health care provider for regular wellness exams and vaccines. This may include: Scheduling regular health, dental, and eye exams. Getting and maintaining your vaccines. These include: Influenza vaccine. Get this vaccine each year before the flu season begins. Pneumonia vaccine. Shingles vaccine. Tetanus, diphtheria, and pertussis (Tdap) booster vaccine. Your health care provider may also recommend other immunizations. Tell your health care provider if you have ever been abused or do not feel safe at home. Summary Menopause is a normal process in which your ability to get pregnant comes to an end. This condition causes hot flashes, night sweats, decreased interest in sex, mood swings, headaches, or lack  of sleep. Treatment for this condition may include hormone replacement therapy. Take actions to keep yourself healthy, including exercising regularly, eating a healthy diet, watching your weight, and checking your blood pressure and blood sugar levels. Get screened for cancer and depression. Make sure that you are up to date with all your vaccines. This information is not intended to replace advice given to you by your health care provider. Make sure you discuss any questions you have with your health care provider. Document Revised: 12/29/2020 Document Reviewed: 12/29/2020 Elsevier Patient Education  2023 [employer]  

## 2022-02-11 NOTE — Assessment & Plan Note (Signed)
Encourage diet and exercise for weight loss 

## 2022-02-11 NOTE — Assessment & Plan Note (Signed)
Continue Venlafaxine, refilled today Support offered

## 2022-02-12 LAB — COMPREHENSIVE METABOLIC PANEL
ALT: 37 IU/L — ABNORMAL HIGH (ref 0–32)
AST: 28 IU/L (ref 0–40)
Albumin/Globulin Ratio: 2.1 (ref 1.2–2.2)
Albumin: 4.9 g/dL (ref 3.8–4.9)
Alkaline Phosphatase: 66 IU/L (ref 44–121)
BUN/Creatinine Ratio: 21 (ref 9–23)
BUN: 18 mg/dL (ref 6–24)
Bilirubin Total: 0.5 mg/dL (ref 0.0–1.2)
CO2: 22 mmol/L (ref 20–29)
Calcium: 10.5 mg/dL — ABNORMAL HIGH (ref 8.7–10.2)
Chloride: 101 mmol/L (ref 96–106)
Creatinine, Ser: 0.87 mg/dL (ref 0.57–1.00)
Globulin, Total: 2.3 g/dL (ref 1.5–4.5)
Glucose: 93 mg/dL (ref 70–99)
Potassium: 4.5 mmol/L (ref 3.5–5.2)
Sodium: 137 mmol/L (ref 134–144)
Total Protein: 7.2 g/dL (ref 6.0–8.5)
eGFR: 81 mL/min/{1.73_m2} (ref >59)

## 2022-02-25 ENCOUNTER — Encounter: Payer: Self-pay | Admitting: Internal Medicine

## 2022-03-21 ENCOUNTER — Encounter: Payer: Self-pay | Admitting: Internal Medicine

## 2022-03-22 MED ORDER — TRAZODONE HCL 50 MG PO TABS: 50.0000 mg | ORAL_TABLET | Freq: Every evening | ORAL | 1 refills | Status: DC | PRN

## 2022-05-09 ENCOUNTER — Other Ambulatory Visit: Payer: Self-pay | Admitting: Internal Medicine

## 2022-05-09 DIAGNOSIS — F419 Anxiety disorder, unspecified: Secondary | ICD-10-CM

## 2022-05-10 NOTE — Telephone Encounter (Signed)
Requested medication (s) are due for refill today: yes  Requested medication (s) are on the active medication list: yes  Last refill:  02/11/22 #90 3 refills  Future visit scheduled: no  Notes to clinic:  protocol failed last labs 02/09/21. Do you want to continue refills?     Requested Prescriptions  Pending Prescriptions Disp Refills   venlafaxine XR (EFFEXOR-XR) 75 MG 24 hr capsule [Pharmacy Med Name: VENLAFAXINE ER 75MG  CAPSULES] 90 capsule 3    Sig: TAKE 1 CAPSULE(75 MG) BY MOUTH DAILY WITH BREAKFAST     Psychiatry: Antidepressants - SNRI - desvenlafaxine & venlafaxine Failed - 05/09/2022  9:46 AM      Failed - Lipid Panel in normal range within the last 12 months    Cholesterol, Total  Date Value Ref Range Status  02/09/2021 204 (H) 100 - 199 mg/dL Final   LDL Chol Calc (NIH)  Date Value Ref Range Status  02/09/2021 125 (H) 0 - 99 mg/dL Final   HDL  Date Value Ref Range Status  02/09/2021 70 >39 mg/dL Final   Triglycerides  Date Value Ref Range Status  02/09/2021 47 0 - 149 mg/dL Final         Passed - Cr in normal range and within 360 days    Creatinine, Ser  Date Value Ref Range Status  02/11/2022 0.87 0.57 - 1.00 mg/dL Final         Passed - Last BP in normal range    BP Readings from Last 1 Encounters:  02/11/22 134/88         Passed - Valid encounter within last 6 months    Recent Outpatient Visits           2 months ago Encounter for general adult medical examination with abnormal findings   [place], Coralie Keens, NP   1 year ago Encounter for general adult medical examination with abnormal findings   [facility] [place], Coralie Keens, NP

## 2022-05-15 ENCOUNTER — Encounter: Payer: Self-pay | Admitting: Internal Medicine

## 2022-06-09 IMAGING — MG MM DIGITAL SCREENING BILAT W/ TOMO AND CAD
8 series · 8 of 24 positions shown · non-contrast
Comparison: Previous exam(s).

CLINICAL DATA: Screening.

EXAM:
DIGITAL SCREENING BILATERAL MAMMOGRAM WITH TOMOSYNTHESIS AND CAD
TECHNIQUE: Bilateral screening digital craniocaudal and mediolateral oblique
mammograms were obtained. Bilateral screening digital breast
tomosynthesis was performed. The images were evaluated with
computer-aided detection.

[L CC synth-2D]
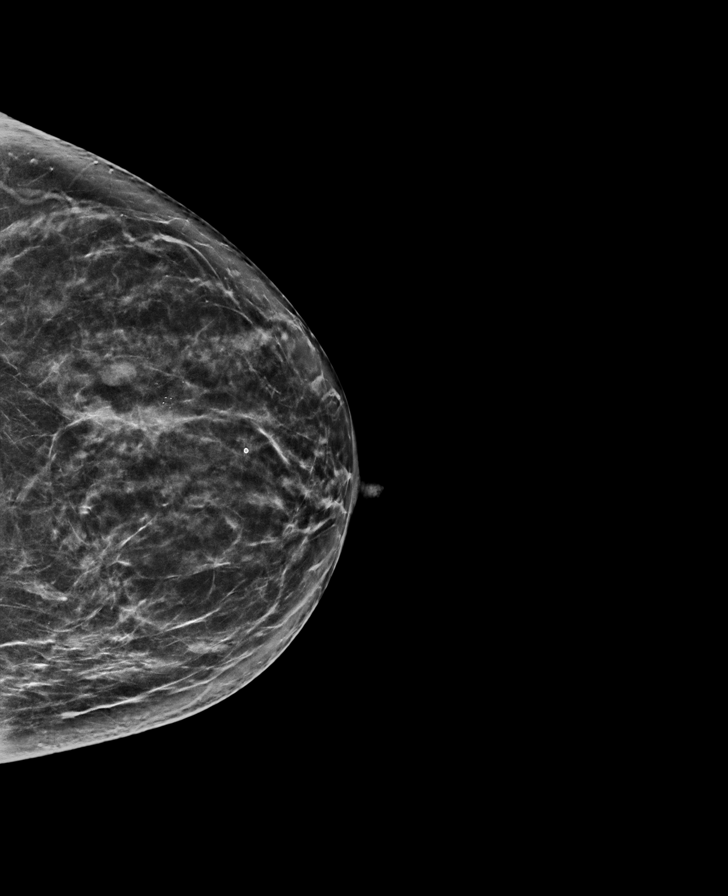

[R CC synth-2D]
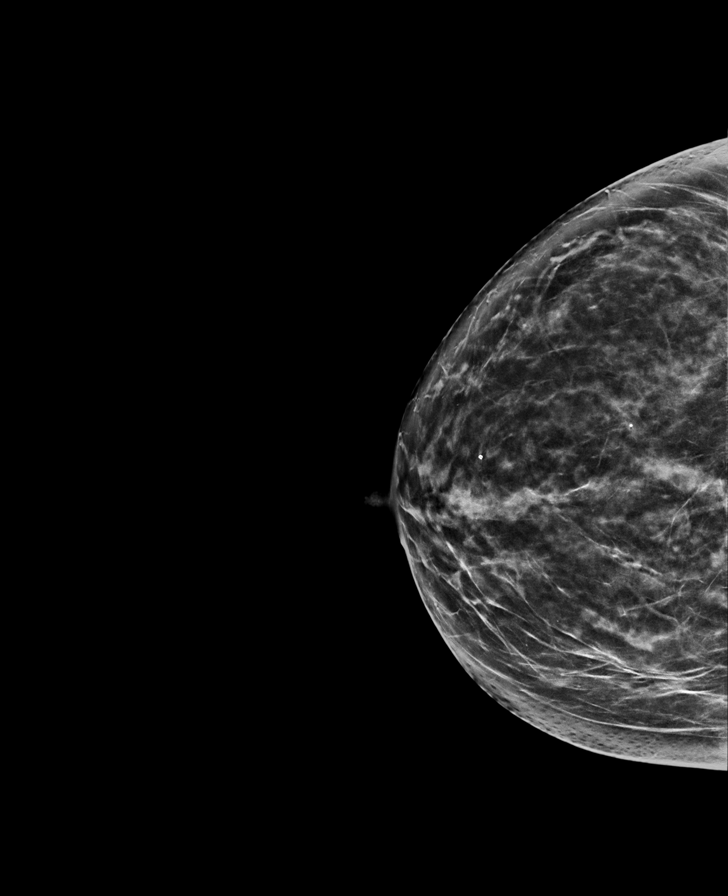

[R MLO synth-2D]
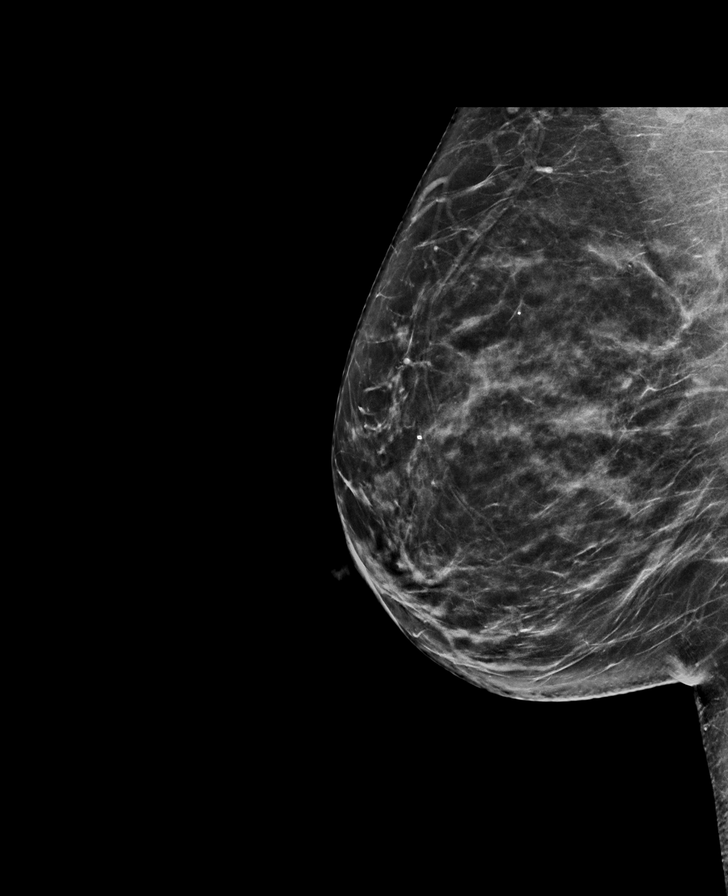

[L MLO synth-2D]
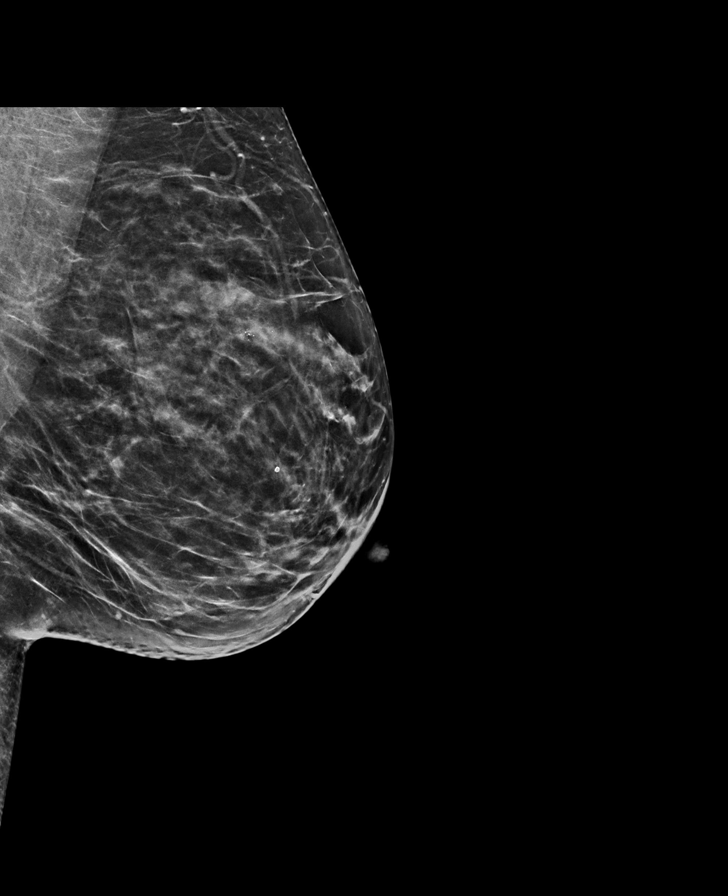

[R CC tomo · tomo slice 37/73.0]
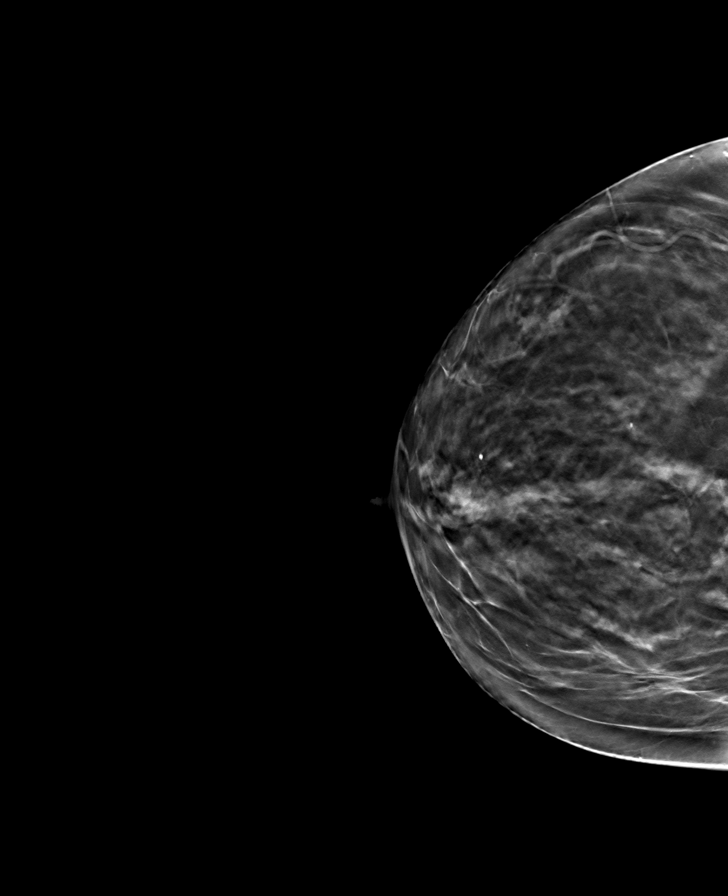

[L MLO tomo · tomo slice 35/70.0]
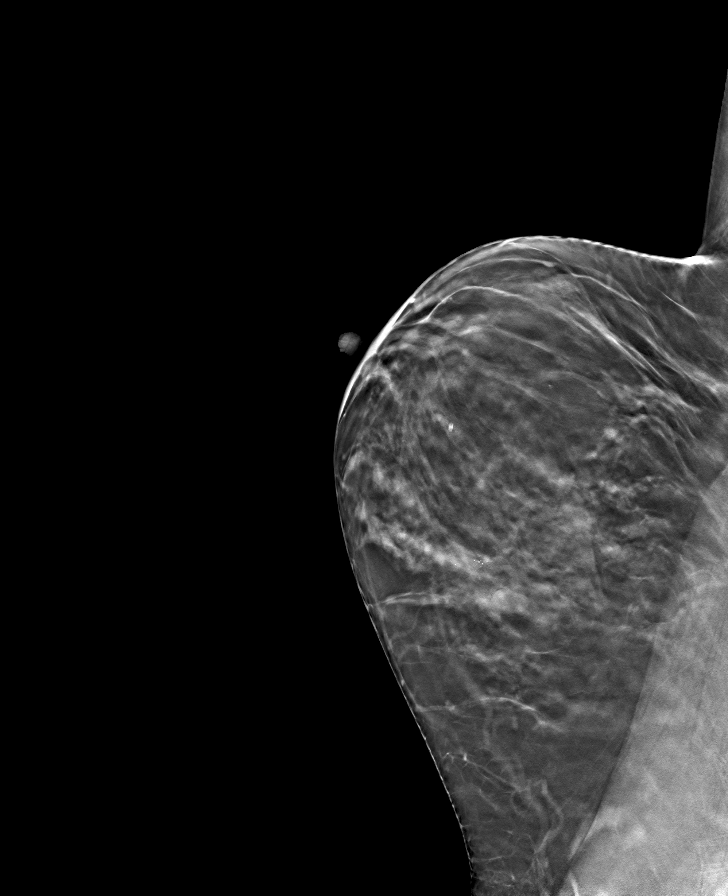

[L CC tomo · tomo slice 37/72.0]
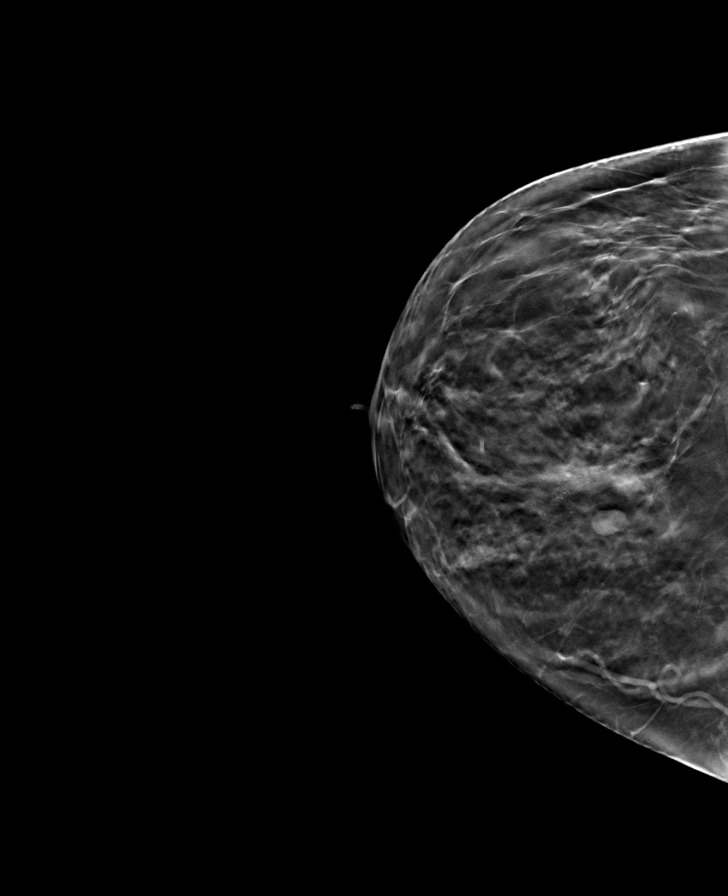

[R MLO tomo · tomo slice 37/74.0]
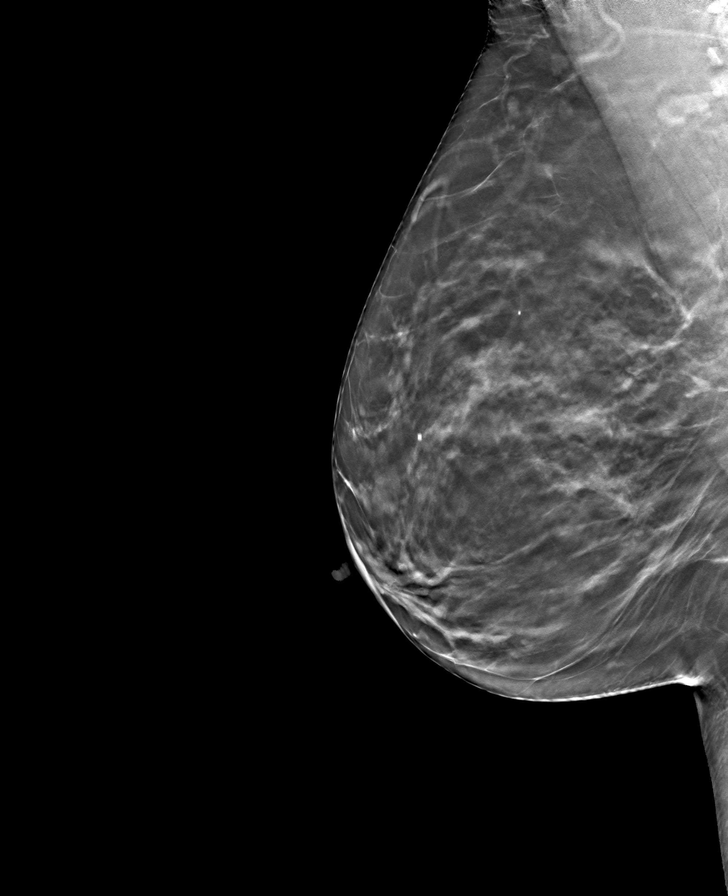

[8 of 24 positions shown; findings below may reference images not displayed]

ACR Breast Density Category c: The breast tissue is heterogeneously
dense, which may obscure small masses.
FINDINGS: There are no findings suspicious for malignancy.
IMPRESSION: No mammographic evidence of malignancy. A result letter of this
screening mammogram will be mailed directly to the patient.

RECOMMENDATION:
Screening mammogram in one year. (Code:[id number]-BC3)

BI-RADS CATEGORY  1: Negative.

## 2022-08-04 ENCOUNTER — Ambulatory Visit
Admission: RE | Admit: 2022-08-04 | Discharge: 2022-08-04 | Disposition: A | Discharge status: Home / Self Care | Payer: Managed Care, Other (non HMO) | Source: Ambulatory Visit | Attending: Internal Medicine | Admitting: Internal Medicine

## 2022-08-04 DIAGNOSIS — Z1231 Encounter for screening mammogram for malignant neoplasm of breast: Secondary | ICD-10-CM | POA: Diagnosis not present

## 2022-08-12 ENCOUNTER — Encounter: Payer: Self-pay | Admitting: Internal Medicine

## 2022-08-19 ENCOUNTER — Ambulatory Visit: Payer: Managed Care, Other (non HMO) | Admitting: Internal Medicine

## 2022-08-19 ENCOUNTER — Encounter: Payer: Self-pay | Admitting: Internal Medicine

## 2022-08-19 VITALS — BP 137/76 | HR 75 | Temp 98.5°F | Ht 62.5 in | Wt 189.2 lb

## 2022-08-19 DIAGNOSIS — F419 Anxiety disorder, unspecified: Secondary | ICD-10-CM

## 2022-08-19 DIAGNOSIS — E7800 Pure hypercholesterolemia, unspecified: Secondary | ICD-10-CM | POA: Diagnosis not present

## 2022-08-19 DIAGNOSIS — F5101 Primary insomnia: Secondary | ICD-10-CM

## 2022-08-19 DIAGNOSIS — E6609 Other obesity due to excess calories: Secondary | ICD-10-CM

## 2022-08-19 DIAGNOSIS — Z6834 Body mass index (BMI) 34.0-34.9, adult: Secondary | ICD-10-CM

## 2022-08-19 NOTE — Assessment & Plan Note (Signed)
C-Met and lipid profile today Encouraged her to consume a low-fat diet 

## 2022-08-19 NOTE — Assessment & Plan Note (Signed)
Continue trazodone 

## 2022-08-19 NOTE — Assessment & Plan Note (Signed)
Stable on her current dose of venlafaxine Support offered

## 2022-08-19 NOTE — Assessment & Plan Note (Signed)
Discussed use of phentermine, Qsymia, Contrave, Saxenda, Wegovy and zepbound She will check insurance coverage and let me know which she would like to use Encouraged high-protein, low-carb calorie restricted diet, consuming food every 3 hours

## 2022-08-19 NOTE — Progress Notes (Signed)
Subjective:    Patient ID: Maria Thornton, female    DOB: 22-Jul-1971, 51 y.o.   MRN: [id number]  HPI  Patient presents to clinic today for follow-up of chronic conditions.   Anxiety: Chronic, managed on Venlafaxine.  She is not currently seeing a therapist.  She denies depression, SI/HI.   Insomnia: She has difficulty staying asleep.  She is taking Trazodone as prescribed with good relief of symptoms.  There is no sleep study on file.   HLD: Her last LDL was 125, triglycerides 47, 01/2022.  She is not taking any cholesterol-lowering medication at this time.  She tries to consume a low-fat diet.  She also has concern for weight gain.  Her current weight is 189 LBS with a BMI of 34.05. She has gained 31 lbs in the last 18 months. She does try to restrict calories. She has also tried low carbs.  She drinks amount of water daily.  She does try to get some exercise and on a treadmill and a rowing machine.  Review of Systems     Past Medical History:  Diagnosis Date   Heart murmur    no proph   History of asthma    in the past, very mild   History of Bell's palsy    years ago   Multiple allergies     Current Outpatient Medications  Medication Sig Dispense Refill   Ascorbic Acid (VITAMIN C) 1000 MG tablet      fluticasone (FLONASE) 50 MCG/ACT nasal spray 2 sprays each nostril once daily 2 sprays each nostril once daily 16 g 3   Magnesium Citrate 200 MG TABS Take by mouth.     traZODone (DESYREL) 50 MG tablet Take 1-1.5 tablets (50-75 mg total) by mouth at bedtime as needed. for sleep 225 tablet 1   venlafaxine XR (EFFEXOR-XR) 75 MG 24 hr capsule TAKE 1 CAPSULE(75 MG) BY MOUTH DAILY WITH BREAKFAST 90 capsule 0   No current facility-administered medications for this visit.    Allergies  Allergen Reactions   Sulfonamide Derivatives     REACTION: rash    Family History  Problem Relation Age of Onset   Arthritis Mother        rheumatoid   Hypertension Mother    COPD Mother     Hypertension Father    COPD Father    Diabetes Maternal Grandmother    Asthma Maternal Grandmother    Arthritis Maternal Grandfather        rheumatoid   Hyperlipidemia Maternal Grandfather    Hypertension Maternal Grandfather    Arthritis Paternal Grandmother    Breast cancer Maternal Aunt 53       Invasive Lobular Carcinoma     Social History   Socioeconomic History   Marital status: Married    Spouse name: Not on file   Number of children: 2   Years of education: Not on file   Highest education level: Not on file  Occupational History   Occupation: Event organiser: [employer]  Tobacco Use   Smoking status: Never   Smokeless tobacco: Never  Vaping Use   Vaping Use: Never used  Substance and Sexual Activity   Alcohol use: Yes   Drug use: No   Sexual activity: Not on file  Other Topics Concern   Not on file  Social History Narrative   No regular exercise.   Social Determinants of Health   Financial Resource Strain: Not on file  Food Insecurity:  Not on file  Transportation Needs: Not on file  Physical Activity: Not on file  Stress: Not on file  Social Connections: Not on file  Intimate Partner Violence: Not on file     Constitutional: Patient reports weight denies fever, malaise, fatigue, headache.  HEENT: Denies eye pain, eye redness, ear pain, ringing in the ears, wax buildup, runny nose, nasal congestion, bloody nose, or sore throat. Respiratory: Denies difficulty breathing, shortness of breath, cough or sputum production.   Cardiovascular: Denies chest pain, chest tightness, palpitations or swelling in the hands or feet.  Gastrointestinal: Denies abdominal pain, bloating, constipation, diarrhea or blood in the stool.  GU: Denies urgency, frequency, pain with urination, burning sensation, blood in urine, odor or discharge. Musculoskeletal: Denies decrease in range of motion, difficulty with gait, muscle pain or joint pain and swelling.  Skin: Denies  redness, rashes, lesions or ulcercations.  Neurological: Patient reports insomnia.  Denies dizziness, difficulty with memory, difficulty with speech or problems with balance and coordination.  Psych: Patient is a history of anxiety.  Denies depression, SI/HI.  No other specific complaints in a complete review of systems (except as listed in HPI above).  Objective:   Physical Exam  BP 137/76 (BP Location: Right Arm, Patient Position: Sitting, Cuff Size: Normal)   Pulse 75   Temp 98.5 F (36.9 C) (Oral)   Ht 5' 2.5" (1.588 m)   Wt 189 lb 3.2 oz (85.8 kg)   SpO2 100%   BMI 34.05 kg/m   Wt Readings from Last 3 Encounters:  02/11/22 173 lb (78.5 kg)  02/09/21 158 lb 12.8 oz (72 kg)  04/14/20 158 lb (71.7 kg)    General: Appears her stated age, obese in NAD. Skin: Warm, dry and intact.  Cardiovascular: Normal rate and rhythm. S1,S2 noted.  No murmur, rubs or gallops noted.  Pulmonary/Chest: Normal effort and positive vesicular breath sounds. No respiratory distress. No wheezes, rales or ronchi noted.  Musculoskeletal: No difficulty with gait.  Neurological: Alert and oriented. Coordination normal.     BMET    Component Value Date/Time   NA 137 02/11/2022 0847   K 4.5 02/11/2022 0847   CL 101 02/11/2022 0847   CO2 22 02/11/2022 0847   GLUCOSE 93 02/11/2022 0847   BUN 18 02/11/2022 0847   CREATININE 0.87 02/11/2022 0847   CALCIUM 10.5 (H) 02/11/2022 0847   GFRNONAA 78 02/05/2020 0938   GFRAA 90 02/05/2020 0938    Lipid Panel     Component Value Date/Time   CHOL 204 (H) 02/09/2021 0917   TRIG 47 02/09/2021 0917   HDL 70 02/09/2021 0917   CHOLHDL 2.9 02/09/2021 0917   LDLCALC 125 (H) 02/09/2021 0917    CBC    Component Value Date/Time   WBC 4.0 02/09/2021 0917   RBC 4.50 02/09/2021 0917   HGB 14.2 02/09/2021 0917   HCT 41.3 02/09/2021 0917   PLT 185 02/09/2021 0917   MCV 92 02/09/2021 0917   MCH 31.6 02/09/2021 0917   MCHC 34.4 02/09/2021 0917   RDW 12.6  02/09/2021 0917   LYMPHSABS 1.7 04/01/2016 0000   EOSABS 0.3 04/01/2016 0000   BASOSABS 0.1 04/01/2016 0000    Hgb A1C Lab Results  Component Value Date   HGBA1C 5.3 02/09/2021            Assessment & Plan:     RTC in 6 months for your annual exam Nicki Reaper, NP

## 2022-08-19 NOTE — Patient Instructions (Signed)
Calorie Counting for Weight Loss Calories are units of energy. Your body needs a certain number of calories from food to keep going throughout the day. When you eat or drink more calories than your body needs, your body stores the extra calories mostly as fat. When you eat or drink fewer calories than your body needs, your body burns fat to get the energy it needs. Calorie counting means keeping track of how many calories you eat and drink each day. Calorie counting can be helpful if you need to lose weight. If you eat fewer calories than your body needs, you should lose weight. Ask your health care provider what a healthy weight is for you. For calorie counting to work, you will need to eat the right number of calories each day to lose a healthy amount of weight per week. A dietitian can help you figure out how many calories you need in a day and will suggest ways to reach your calorie goal. A healthy amount of weight to lose each week is usually 1-2 lb (0.5-0.9 kg). This usually means that your daily calorie intake should be reduced by 500-750 calories. Eating 1,200-1,500 calories a day can help most women lose weight. Eating 1,500-1,800 calories a day can help most men lose weight. What do I need to know about calorie counting? Work with your health care provider or dietitian to determine how many calories you should get each day. To meet your daily calorie goal, you will need to: Find out how many calories are in each food that you would like to eat. Try to do this before you eat. Decide how much of the food you plan to eat. Keep a food log. Do this by writing down what you ate and how many calories it had. To successfully lose weight, it is important to balance calorie counting with a healthy lifestyle that includes regular activity. Where do I find calorie information?  The number of calories in a food can be found on a Nutrition Facts label. If a food does not have a Nutrition Facts label, try  to look up the calories online or ask your dietitian for help. Remember that calories are listed per serving. If you choose to have more than one serving of a food, you will have to multiply the calories per serving by the number of servings you plan to eat. For example, the label on a package of bread might say that a serving size is 1 slice and that there are 90 calories in a serving. If you eat 1 slice, you will have eaten 90 calories. If you eat 2 slices, you will have eaten 180 calories. How do I keep a food log? After each time that you eat, record the following in your food log as soon as possible: What you ate. Be sure to include toppings, sauces, and other extras on the food. How much you ate. This can be measured in cups, ounces, or number of items. How many calories were in each food and drink. The total number of calories in the food you ate. Keep your food log near you, such as in a pocket-sized notebook or on an app or website on your mobile phone. Some programs will calculate calories for you and show you how many calories you have left to meet your daily goal. What are some portion-control tips? Know how many calories are in a serving. This will help you know how many servings you can have of a certain   food. Use a measuring cup to measure serving sizes. You could also try weighing out portions on a kitchen scale. With time, you will be able to estimate serving sizes for some foods. Take time to put servings of different foods on your favorite plates or in your favorite bowls and cups so you know what a serving looks like. Try not to eat straight from a food's packaging, such as from a bag or box. Eating straight from the package makes it hard to see how much you are eating and can lead to overeating. Put the amount you would like to eat in a cup or on a plate to make sure you are eating the right portion. Use smaller plates, glasses, and bowls for smaller portions and to prevent  overeating. Try not to multitask. For example, avoid watching TV or using your computer while eating. If it is time to eat, sit down at a table and enjoy your food. This will help you recognize when you are full. It will also help you be more mindful of what and how much you are eating. What are tips for following this plan? Reading food labels Check the calorie count compared with the serving size. The serving size may be smaller than what you are used to eating. Check the source of the calories. Try to choose foods that are high in protein, fiber, and vitamins, and low in saturated fat, trans fat, and sodium. Shopping Read nutrition labels while you shop. This will help you make healthy decisions about which foods to buy. Pay attention to nutrition labels for low-fat or fat-free foods. These foods sometimes have the same number of calories or more calories than the full-fat versions. They also often have added sugar, starch, or salt to make up for flavor that was removed with the fat. Make a grocery list of lower-calorie foods and stick to it. Cooking Try to cook your favorite foods in a healthier way. For example, try baking instead of frying. Use low-fat dairy products. Meal planning Use more fruits and vegetables. One-half of your plate should be fruits and vegetables. Include lean proteins, such as chicken, turkey, and fish. Lifestyle Each week, aim to do one of the following: 150 minutes of moderate exercise, such as walking. 75 minutes of vigorous exercise, such as running. General information Know how many calories are in the foods you eat most often. This will help you calculate calorie counts faster. Find a way of tracking calories that works for you. Get creative. Try different apps or programs if writing down calories does not work for you. What foods should I eat?  Eat nutritious foods. It is better to have a nutritious, high-calorie food, such as an avocado, than a food with  few nutrients, such as a bag of potato chips. Use your calories on foods and drinks that will fill you up and will not leave you hungry soon after eating. Examples of foods that fill you up are nuts and nut butters, vegetables, lean proteins, and high-fiber foods such as whole grains. High-fiber foods are foods with more than 5 g of fiber per serving. Pay attention to calories in drinks. Low-calorie drinks include water and unsweetened drinks. The items listed above may not be a complete list of foods and beverages you can eat. Contact a dietitian for more information. What foods should I limit? Limit foods or drinks that are not good sources of vitamins, minerals, or protein or that are high in unhealthy fats. These   include: Candy. Other sweets. Sodas, specialty coffee drinks, alcohol, and juice. The items listed above may not be a complete list of foods and beverages you should avoid. Contact a dietitian for more information. How do I count calories when eating out? Pay attention to portions. Often, portions are much larger when eating out. Try these tips to keep portions smaller: Consider sharing a meal instead of getting your own. If you get your own meal, eat only half of it. Before you start eating, ask for a container and put half of your meal into it. When available, consider ordering smaller portions from the menu instead of full portions. Pay attention to your food and drink choices. Knowing the way food is cooked and what is included with the meal can help you eat fewer calories. If calories are listed on the menu, choose the lower-calorie options. Choose dishes that include vegetables, fruits, whole grains, low-fat dairy products, and lean proteins. Choose items that are boiled, broiled, grilled, or steamed. Avoid items that are buttered, battered, fried, or served with cream sauce. Items labeled as crispy are usually fried, unless stated otherwise. Choose water, low-fat milk,  unsweetened iced tea, or other drinks without added sugar. If you want an alcoholic beverage, choose a lower-calorie option, such as a glass of wine or light beer. Ask for dressings, sauces, and syrups on the side. These are usually high in calories, so you should limit the amount you eat. If you want a salad, choose a garden salad and ask for grilled meats. Avoid extra toppings such as bacon, cheese, or fried items. Ask for the dressing on the side, or ask for olive oil and vinegar or lemon to use as dressing. Estimate how many servings of a food you are given. Knowing serving sizes will help you be aware of how much food you are eating at restaurants. Where to find more information Centers for Disease Control and Prevention: www.cdc.gov [place] Department of Agriculture: myplate.gov Summary Calorie counting means keeping track of how many calories you eat and drink each day. If you eat fewer calories than your body needs, you should lose weight. A healthy amount of weight to lose per week is usually 1-2 lb (0.5-0.9 kg). This usually means reducing your daily calorie intake by 500-750 calories. The number of calories in a food can be found on a Nutrition Facts label. If a food does not have a Nutrition Facts label, try to look up the calories online or ask your dietitian for help. Use smaller plates, glasses, and bowls for smaller portions and to prevent overeating. Use your calories on foods and drinks that will fill you up and not leave you hungry shortly after a meal. This information is not intended to replace advice given to you by your health care provider. Make sure you discuss any questions you have with your health care provider. Document Revised: 09/20/2019 Document Reviewed: 09/20/2019 Elsevier Patient Education  2023 Elsevier Inc.  

## 2022-08-20 ENCOUNTER — Encounter: Payer: Self-pay | Admitting: Internal Medicine

## 2022-08-20 LAB — LIPID PANEL
Chol/HDL Ratio: 2.8 ratio (ref 0.0–4.4)
Cholesterol, Total: 234 mg/dL — ABNORMAL HIGH (ref 100–199)
HDL: 85 mg/dL (ref >39)
LDL Chol Calc (NIH): 140 mg/dL — ABNORMAL HIGH (ref 0–99)
Triglycerides: 53 mg/dL (ref 0–149)
VLDL Cholesterol Cal: 9 mg/dL (ref 5–40)

## 2022-08-20 LAB — COMPREHENSIVE METABOLIC PANEL
ALT: 53 IU/L — ABNORMAL HIGH (ref 0–32)
AST: 36 IU/L (ref 0–40)
Albumin/Globulin Ratio: 2 (ref 1.2–2.2)
Albumin: 4.7 g/dL (ref 3.8–4.9)
Alkaline Phosphatase: 84 IU/L (ref 44–121)
BUN/Creatinine Ratio: 16 (ref 9–23)
BUN: 14 mg/dL (ref 6–24)
Bilirubin Total: 0.4 mg/dL (ref 0.0–1.2)
CO2: 24 mmol/L (ref 20–29)
Calcium: 10.2 mg/dL (ref 8.7–10.2)
Chloride: 103 mmol/L (ref 96–106)
Creatinine, Ser: 0.85 mg/dL (ref 0.57–1.00)
Globulin, Total: 2.3 g/dL (ref 1.5–4.5)
Glucose: 99 mg/dL (ref 70–99)
Potassium: 5.3 mmol/L — ABNORMAL HIGH (ref 3.5–5.2)
Sodium: 139 mmol/L (ref 134–144)
Total Protein: 7 g/dL (ref 6.0–8.5)
eGFR: 83 mL/min/{1.73_m2} (ref >59)

## 2022-08-20 LAB — VITAMIN B12: Vitamin B-12: 591 pg/mL (ref 232–1245)

## 2022-08-20 LAB — CBC
Hematocrit: 44.9 % (ref 34.0–46.6)
Hemoglobin: 14.9 g/dL (ref 11.1–15.9)
MCH: 31.3 pg (ref 26.6–33.0)
MCHC: 33.2 g/dL (ref 31.5–35.7)
MCV: 94 fL (ref 79–97)
Platelets: 217 10*3/uL (ref 150–450)
RBC: 4.76 x10E6/uL (ref 3.77–5.28)
RDW: 12.4 % (ref 11.7–15.4)
WBC: 5.1 10*3/uL (ref 3.4–10.8)

## 2022-08-20 LAB — VITAMIN D 25 HYDROXY (VIT D DEFICIENCY, FRACTURES): Vit D, 25-Hydroxy: 46.2 ng/mL (ref 30.0–100.0)

## 2022-08-20 LAB — HEMOGLOBIN A1C
Est. average glucose Bld gHb Est-mCnc: 103 mg/dL
Hgb A1c MFr Bld: 5.2 % (ref 4.8–5.6)

## 2022-08-20 LAB — TSH: TSH: 1.29 u[IU]/mL (ref 0.450–4.500)

## 2022-09-01 ENCOUNTER — Encounter: Payer: Self-pay | Admitting: Internal Medicine

## 2022-09-02 MED ORDER — TRAZODONE HCL 50 MG PO TABS: 50.0000 mg | ORAL_TABLET | Freq: Every evening | ORAL | 1 refills | Status: DC | PRN

## 2022-09-07 ENCOUNTER — Encounter: Payer: Self-pay | Admitting: Internal Medicine

## 2022-09-07 MED ORDER — ESTRADIOL 0.025 MG/24HR TD PTWK: 0.0250 mg | MEDICATED_PATCH | TRANSDERMAL | 0 refills | Status: DC

## 2022-09-07 MED ORDER — PROGESTERONE MICRONIZED 100 MG PO CAPS: 100.0000 mg | ORAL_CAPSULE | Freq: Every day | ORAL | 0 refills | Status: DC

## 2022-10-29 MED ORDER — PROGESTERONE MICRONIZED 100 MG PO CAPS: 100.0000 mg | ORAL_CAPSULE | Freq: Every day | ORAL | 0 refills | Status: DC

## 2022-10-29 NOTE — Addendum Note (Signed)
Addended by: Jearld Fenton on: 10/29/2022 12:15 PM   Modules accepted: Orders

## 2023-01-24 ENCOUNTER — Other Ambulatory Visit: Payer: Self-pay | Admitting: Internal Medicine

## 2023-01-24 ENCOUNTER — Encounter: Payer: Self-pay | Admitting: Internal Medicine

## 2023-01-24 ENCOUNTER — Other Ambulatory Visit: Payer: Self-pay

## 2023-01-24 MED ORDER — MINOCYCLINE HCL 50 MG PO CAPS
50.0000 mg | ORAL_CAPSULE | Freq: Two times a day (BID) | ORAL | 1 refills | Status: DC
  Filled 2023-01-24: qty 90, 45d supply, fill #0

## 2023-02-17 ENCOUNTER — Other Ambulatory Visit: Payer: Self-pay | Admitting: Internal Medicine

## 2023-02-17 ENCOUNTER — Ambulatory Visit (INDEPENDENT_AMBULATORY_CARE_PROVIDER_SITE_OTHER): Payer: Managed Care, Other (non HMO) | Admitting: Internal Medicine

## 2023-02-17 ENCOUNTER — Encounter: Payer: Self-pay | Admitting: Internal Medicine

## 2023-02-17 VITALS — BP 136/82 | HR 79 | Temp 96.1°F | Ht 63.0 in | Wt 182.0 lb

## 2023-02-17 DIAGNOSIS — E6609 Other obesity due to excess calories: Secondary | ICD-10-CM

## 2023-02-17 DIAGNOSIS — Z6832 Body mass index (BMI) 32.0-32.9, adult: Secondary | ICD-10-CM

## 2023-02-17 DIAGNOSIS — F419 Anxiety disorder, unspecified: Secondary | ICD-10-CM

## 2023-02-17 DIAGNOSIS — R7309 Other abnormal glucose: Secondary | ICD-10-CM | POA: Diagnosis not present

## 2023-02-17 DIAGNOSIS — Z124 Encounter for screening for malignant neoplasm of cervix: Secondary | ICD-10-CM | POA: Diagnosis not present

## 2023-02-17 DIAGNOSIS — Z0001 Encounter for general adult medical examination with abnormal findings: Secondary | ICD-10-CM

## 2023-02-17 DIAGNOSIS — E7800 Pure hypercholesterolemia, unspecified: Secondary | ICD-10-CM | POA: Diagnosis not present

## 2023-02-17 MED ORDER — NALTREXONE-BUPROPION HCL ER 8-90 MG PO TB12: ORAL_TABLET | ORAL | 1 refills | Status: DC

## 2023-02-17 NOTE — Addendum Note (Signed)
Addended by: Lorre Munroe on: 02/17/2023 09:04 AM   Modules accepted: Orders

## 2023-02-17 NOTE — Progress Notes (Addendum)
Subjective:    Patient ID: Maria Thornton, female    DOB: Aug 30, 1970, 52 y.o.   MRN: [id number]  HPI  Patient presents to clinic today for her annual exam.  Flu: 04/2022 Tetanus: 01/2020 COVID: Pfizer x 2 Shingrix: 02/2021 Pap smear: 03/2018 Mammogram: 07/2022 Colon screening: 03/2020 Vision screening: annually Dentist: biannually  Diet: She does eat meat. She consumes fruits and veggies. She tries to avoid fried foods. She drinks mostly water. Exercise: treadmill, rowing machine, weights  Review of Systems  Past Medical History:  Diagnosis Date   Heart murmur    no proph   History of asthma    in the past, very mild   History of Bell's palsy    years ago   Multiple allergies     Current Outpatient Medications  Medication Sig Dispense Refill   Ascorbic Acid (VITAMIN C) 1000 MG tablet      Black Cohosh-SoyIsoflav-C Quad (ESTROVEN MENOPAUSE & WEIGHT) 40-56-300 MG CAPS      estradiol (CLIMARA - DOSED IN MG/24 HR) 0.025 mg/24hr patch Place 1 patch (0.025 mg total) onto the skin once a week. 12 patch 0   fluticasone (FLONASE) 50 MCG/ACT nasal spray 2 sprays each nostril once daily 2 sprays each nostril once daily 16 g 3   Magnesium Citrate 200 MG TABS Take by mouth.     minocycline (MINOCIN) 50 MG capsule Take 1 capsule (50 mg total) by mouth 2 (two) times daily. 90 capsule 1   progesterone (PROMETRIUM) 100 MG capsule Take 1 capsule (100 mg total) by mouth daily. 90 capsule 0   traZODone (DESYREL) 50 MG tablet Take 1-1.5 tablets (50-75 mg total) by mouth at bedtime as needed. for sleep 135 tablet 1   venlafaxine XR (EFFEXOR-XR) 75 MG 24 hr capsule TAKE 1 CAPSULE(75 MG) BY MOUTH DAILY WITH BREAKFAST 90 capsule 0   No current facility-administered medications for this visit.    Allergies  Allergen Reactions   Sulfonamide Derivatives     REACTION: rash    Family History  Problem Relation Age of Onset   Arthritis Mother        rheumatoid   Hypertension Mother    COPD  Mother    Hypertension Father    COPD Father    Diabetes Maternal Grandmother    Asthma Maternal Grandmother    Arthritis Maternal Grandfather        rheumatoid   Hyperlipidemia Maternal Grandfather    Hypertension Maternal Grandfather    Arthritis Paternal Grandmother    Breast cancer Maternal Aunt 53       Invasive Lobular Carcinoma     Social History   Socioeconomic History   Marital status: Married    Spouse name: Not on file   Number of children: 2   Years of education: Not on file   Highest education level: Not on file  Occupational History   Occupation: Event organiser: [employer]  Tobacco Use   Smoking status: Never   Smokeless tobacco: Never  Vaping Use   Vaping Use: Never used  Substance and Sexual Activity   Alcohol use: Yes   Drug use: No   Sexual activity: Not on file  Other Topics Concern   Not on file  Social History Narrative   No regular exercise.   Social Determinants of Health   Financial Resource Strain: Not on file  Food Insecurity: Not on file  Transportation Needs: Not on file  Physical Activity: Not on  file  Stress: Not on file  Social Connections: Not on file  Intimate Partner Violence: Not on file     Constitutional: Denies fever, malaise, fatigue, headache or abrupt weight changes.  HEENT: Denies eye pain, eye redness, ear pain, ringing in the ears, wax buildup, runny nose, nasal congestion, bloody nose, or sore throat. Respiratory: Denies difficulty breathing, shortness of breath, cough or sputum production.   Cardiovascular: Denies chest pain, chest tightness, palpitations or swelling in the hands or feet.  Gastrointestinal: Patient reports intermittent constipation.  Denies abdominal pain, bloating, diarrhea or blood in the stool.  GU: Denies urgency, frequency, pain with urination, burning sensation, blood in urine, odor or discharge. Musculoskeletal: Denies decrease in range of motion, difficulty with gait, muscle pain or  joint pain and swelling.  Skin: Denies redness, rashes, lesions or ulcercations.  Neurological: Patient reports insomnia.  Denies dizziness, difficulty with memory, difficulty with speech or problems with balance and coordination.  Psych: Patient has a history of anxiety.  Denies depression, SI/HI.  No other specific complaints in a complete review of systems (except as listed in HPI above).     Objective:   Physical Exam  BP 136/82 (BP Location: Right Arm, Patient Position: Sitting, Cuff Size: Normal)   Pulse 79   Temp (!) 96.1 F (35.6 C) (Tympanic)   Ht 5\' 3"  (1.6 m)   Wt 182 lb (82.6 kg)   SpO2 97%   BMI 32.24 kg/m   Wt Readings from Last 3 Encounters:  08/19/22 189 lb 3.2 oz (85.8 kg)  02/11/22 173 lb (78.5 kg)  02/09/21 158 lb 12.8 oz (72 kg)    General: Appears her stated age, obese, in NAD. Skin: Warm, dry and intact.  HEENT: Head: normal shape and size; Eyes: sclera white, no icterus, conjunctiva pink, PERRLA and EOMs intact;  Neck:  Neck supple, trachea midline. No masses, lumps or thyromegaly present.  Cardiovascular: Normal rate and rhythm. S1,S2 noted.  No murmur, rubs or gallops noted. No JVD or BLE edema. No carotid bruits noted. Pulmonary/Chest: Normal effort and positive vesicular breath sounds. No respiratory distress. No wheezes, rales or ronchi noted.  Abdomen: Soft and nontender. Normal bowel sounds.  Pelvic: Normal female anatomy.  Cervix without mass or lesion.  No CMT.  Adnexa nonpalpable. Musculoskeletal: Strength 5/5 BUE/BLE. No difficulty with gait.  Neurological: Alert and oriented. Cranial nerves II-XII grossly intact. Coordination normal.  Psychiatric: Mood and affect normal. Behavior is normal. Judgment and thought content normal.    BMET    Component Value Date/Time   NA 139 08/19/2022 0917   K 5.3 (H) 08/19/2022 0917   CL 103 08/19/2022 0917   CO2 24 08/19/2022 0917   GLUCOSE 99 08/19/2022 0917   BUN 14 08/19/2022 0917   CREATININE  0.85 08/19/2022 0917   CALCIUM 10.2 08/19/2022 0917   GFRNONAA 78 02/05/2020 0938   GFRAA 90 02/05/2020 0938    Lipid Panel     Component Value Date/Time   CHOL 234 (H) 08/19/2022 0917   TRIG 53 08/19/2022 0917   HDL 85 08/19/2022 0917   CHOLHDL 2.8 08/19/2022 0917   LDLCALC 140 (H) 08/19/2022 0917    CBC    Component Value Date/Time   WBC 5.1 08/19/2022 0917   RBC 4.76 08/19/2022 0917   HGB 14.9 08/19/2022 0917   HCT 44.9 08/19/2022 0917   PLT 217 08/19/2022 0917   MCV 94 08/19/2022 0917   MCH 31.3 08/19/2022 0917   MCHC 33.2 08/19/2022  0917   RDW 12.4 08/19/2022 0917   LYMPHSABS 1.7 04/01/2016 0000   EOSABS 0.3 04/01/2016 0000   BASOSABS 0.1 04/01/2016 0000    Hgb A1C Lab Results  Component Value Date   HGBA1C 5.2 08/19/2022           Assessment & Plan:   Preventative health maintenance:  Encouraged her to get a flu shot in the fall Tetanus UTD Encouraged her to get her COVID booster Encouraged her to get her second Shingrix vaccine Pap smear today-she declines STD screening Mammogram will be ordered at her follow-up appointment in 6 months Colon screening UTD Encouraged her to consume a balanced diet and exercise regimen Advised her to see an eye doctor and dentist annually Will check CBC, c-Met, lipid, A1c today  RTC in 6 months, follow-up chronic conditions Nicki Reaper, NP

## 2023-02-17 NOTE — Patient Instructions (Signed)
Health Maintenance for Postmenopausal Women Menopause is a normal process in which your ability to get pregnant comes to an end. This process happens slowly over many months or years, usually between the ages of 48 and 55. Menopause is complete when you have missed your menstrual period for 12 months. It is important to talk with your health care provider about some of the most common conditions that affect women after menopause (postmenopausal women). These include heart disease, cancer, and bone loss (osteoporosis). Adopting a healthy lifestyle and getting preventive care can help to promote your health and wellness. The actions you take can also lower your chances of developing some of these common conditions. What are the signs and symptoms of menopause? During menopause, you may have the following symptoms: Hot flashes. These can be moderate or severe. Night sweats. Decrease in sex drive. Mood swings. Headaches. Tiredness (fatigue). Irritability. Memory problems. Problems falling asleep or staying asleep. Talk with your health care provider about treatment options for your symptoms. Do I need hormone replacement therapy? Hormone replacement therapy is effective in treating symptoms that are caused by menopause, such as hot flashes and night sweats. Hormone replacement carries certain risks, especially as you become older. If you are thinking about using estrogen or estrogen with progestin, discuss the benefits and risks with your health care provider. How can I reduce my risk for heart disease and stroke? The risk of heart disease, heart attack, and stroke increases as you age. One of the causes may be a change in the body's hormones during menopause. This can affect how your body uses dietary fats, triglycerides, and cholesterol. Heart attack and stroke are medical emergencies. There are many things that you can do to help prevent heart disease and stroke. Watch your blood pressure High  blood pressure causes heart disease and increases the risk of stroke. This is more likely to develop in people who have high blood pressure readings or are overweight. Have your blood pressure checked: Every 3-5 years if you are 18-39 years of age. Every year if you are 40 years old or older. Eat a healthy diet  Eat a diet that includes plenty of vegetables, fruits, low-fat dairy products, and lean protein. Do not eat a lot of foods that are high in solid fats, added sugars, or sodium. Get regular exercise Get regular exercise. This is one of the most important things you can do for your health. Most adults should: Try to exercise for at least 150 minutes each week. The exercise should increase your heart rate and make you sweat (moderate-intensity exercise). Try to do strengthening exercises at least twice each week. Do these in addition to the moderate-intensity exercise. Spend less time sitting. Even light physical activity can be beneficial. Other tips Work with your health care provider to achieve or maintain a healthy weight. Do not use any products that contain nicotine or tobacco. These products include cigarettes, chewing tobacco, and vaping devices, such as e-cigarettes. If you need help quitting, ask your health care provider. Know your numbers. Ask your health care provider to check your cholesterol and your blood sugar (glucose). Continue to have your blood tested as directed by your health care provider. Do I need screening for cancer? Depending on your health history and family history, you may need to have cancer screenings at different stages of your life. This may include screening for: Breast cancer. Cervical cancer. Lung cancer. Colorectal cancer. What is my risk for osteoporosis? After menopause, you may be   at increased risk for osteoporosis. Osteoporosis is a condition in which bone destruction happens more quickly than new bone creation. To help prevent osteoporosis or  the bone fractures that can happen because of osteoporosis, you may take the following actions: If you are 19-50 years old, get at least 1,000 mg of calcium and at least 600 international units (IU) of vitamin D per day. If you are older than age 50 but younger than age 70, get at least 1,200 mg of calcium and at least 600 international units (IU) of vitamin D per day. If you are older than age 70, get at least 1,200 mg of calcium and at least 800 international units (IU) of vitamin D per day. Smoking and drinking excessive alcohol increase the risk of osteoporosis. Eat foods that are rich in calcium and vitamin D, and do weight-bearing exercises several times each week as directed by your health care provider. How does menopause affect my mental health? Depression may occur at any age, but it is more common as you become older. Common symptoms of depression include: Feeling depressed. Changes in sleep patterns. Changes in appetite or eating patterns. Feeling an overall lack of motivation or enjoyment of activities that you previously enjoyed. Frequent crying spells. Talk with your health care provider if you think that you are experiencing any of these symptoms. General instructions See your health care provider for regular wellness exams and vaccines. This may include: Scheduling regular health, dental, and eye exams. Getting and maintaining your vaccines. These include: Influenza vaccine. Get this vaccine each year before the flu season begins. Pneumonia vaccine. Shingles vaccine. Tetanus, diphtheria, and pertussis (Tdap) booster vaccine. Your health care provider may also recommend other immunizations. Tell your health care provider if you have ever been abused or do not feel safe at home. Summary Menopause is a normal process in which your ability to get pregnant comes to an end. This condition causes hot flashes, night sweats, decreased interest in sex, mood swings, headaches, or lack  of sleep. Treatment for this condition may include hormone replacement therapy. Take actions to keep yourself healthy, including exercising regularly, eating a healthy diet, watching your weight, and checking your blood pressure and blood sugar levels. Get screened for cancer and depression. Make sure that you are up to date with all your vaccines. This information is not intended to replace advice given to you by your health care provider. Make sure you discuss any questions you have with your health care provider. Document Revised: 12/29/2020 Document Reviewed: 12/29/2020 Elsevier Patient Education  2024 [employer] Inc.  

## 2023-02-17 NOTE — Assessment & Plan Note (Addendum)
Encourage diet and exercise for weight loss Will trial Contrave

## 2023-02-18 ENCOUNTER — Encounter: Payer: Managed Care, Other (non HMO) | Admitting: Internal Medicine

## 2023-02-18 LAB — COMPREHENSIVE METABOLIC PANEL
ALT: 50 IU/L — ABNORMAL HIGH (ref 0–32)
AST: 32 IU/L (ref 0–40)
Albumin: 4.5 g/dL (ref 3.8–4.9)
Alkaline Phosphatase: 74 IU/L (ref 44–121)
BUN/Creatinine Ratio: 20 (ref 9–23)
BUN: 17 mg/dL (ref 6–24)
Bilirubin Total: 0.5 mg/dL (ref 0.0–1.2)
CO2: 26 mmol/L (ref 20–29)
Calcium: 10 mg/dL (ref 8.7–10.2)
Chloride: 102 mmol/L (ref 96–106)
Creatinine, Ser: 0.83 mg/dL (ref 0.57–1.00)
Globulin, Total: 2.3 g/dL (ref 1.5–4.5)
Glucose: 101 mg/dL — ABNORMAL HIGH (ref 70–99)
Potassium: 4.8 mmol/L (ref 3.5–5.2)
Sodium: 139 mmol/L (ref 134–144)
Total Protein: 6.8 g/dL (ref 6.0–8.5)
eGFR: 85 mL/min/{1.73_m2} (ref >59)

## 2023-02-18 LAB — CBC
Hematocrit: 42.4 % (ref 34.0–46.6)
Hemoglobin: 14.2 g/dL (ref 11.1–15.9)
MCH: 31.5 pg (ref 26.6–33.0)
MCHC: 33.5 g/dL (ref 31.5–35.7)
MCV: 94 fL (ref 79–97)
Platelets: 201 10*3/uL (ref 150–450)
RBC: 4.51 x10E6/uL (ref 3.77–5.28)
RDW: 12.5 % (ref 11.7–15.4)
WBC: 4.5 10*3/uL (ref 3.4–10.8)

## 2023-02-18 LAB — LIPID PANEL
Chol/HDL Ratio: 2.6 ratio (ref 0.0–4.4)
Cholesterol, Total: 193 mg/dL (ref 100–199)
HDL: 75 mg/dL (ref >39)
LDL Chol Calc (NIH): 107 mg/dL — ABNORMAL HIGH (ref 0–99)
Triglycerides: 57 mg/dL (ref 0–149)
VLDL Cholesterol Cal: 11 mg/dL (ref 5–40)

## 2023-02-18 LAB — HEMOGLOBIN A1C
Est. average glucose Bld gHb Est-mCnc: 111 mg/dL
Hgb A1c MFr Bld: 5.5 % (ref 4.8–5.6)

## 2023-02-18 NOTE — Telephone Encounter (Signed)
Requested Prescriptions  Pending Prescriptions Disp Refills   traZODone (DESYREL) 50 MG tablet [Pharmacy Med Name: TRAZODONE 50MG  TABLETS] 135 tablet 0    Sig: TAKE 1 TO 1 AND 1/2 TABLETS( 50 TO 75 MG TOTAL) BY MOUTH AT BEDTIME AS NEEDED FOR SLEEP     Psychiatry: Antidepressants - Serotonin Modulator Failed - 02/17/2023  7:07 AM      Failed - Valid encounter within last 6 months    Recent Outpatient Visits           Yesterday Encounter for general adult medical examination with abnormal findings   [facility] [place], [place] W, NP   6 months ago Class 1 obesity due to excess calories with serious comorbidity and body mass index (BMI) of 34.0 to 34.9 in adult   [facility] [place], Salvadore Oxford, NP   1 year ago Encounter for general adult medical examination with abnormal findings   [facility] [place], Salvadore Oxford, NP   2 years ago Encounter for general adult medical examination with abnormal findings   [facility] [place], Salvadore Oxford, NP       Future Appointments             In 6 months Baity, Salvadore Oxford, NP [place] [facility]             venlafaxine XR (EFFEXOR-XR) 75 MG 24 hr capsule [Pharmacy Med Name: VENLAFAXINE ER 75MG  CAPSULES] 90 capsule 0    Sig: TAKE 1 CAPSULE(75 MG) BY MOUTH DAILY WITH BREAKFAST     Psychiatry: Antidepressants - SNRI - desvenlafaxine & venlafaxine Failed - 02/17/2023  7:07 AM      Failed - Valid encounter within last 6 months    Recent Outpatient Visits           Yesterday Encounter for general adult medical examination with abnormal findings   [place] [place], [place] W, NP   6 months ago Class 1 obesity due to excess calories with serious comorbidity and body mass index (BMI) of 34.0 to 34.9 in adult   [facility] [place], Salvadore Oxford, NP   1 year ago Encounter for general  adult medical examination with abnormal findings   [place] [place], Salvadore Oxford, NP   2 years ago Encounter for general adult medical examination with abnormal findings   [place] [facility] [place], Salvadore Oxford, NP       Future Appointments             In 6 months Sampson Si, Salvadore Oxford, NP [place] [facility]            Failed - Lipid Panel in normal range within the last 12 months    Cholesterol, Total  Date Value Ref Range Status  02/17/2023 193 100 - 199 mg/dL Final   LDL Chol Calc (NIH)  Date Value Ref Range Status  02/17/2023 107 (H) 0 - 99 mg/dL Final   HDL  Date Value Ref Range Status  02/17/2023 75 >39 mg/dL Final   Triglycerides  Date Value Ref Range Status  02/17/2023 57 0 - 149 mg/dL Final         Passed - Cr in normal range and within 360 days    Creatinine, Ser  Date Value Ref Range Status  02/17/2023  0.83 0.57 - 1.00 mg/dL Final         Passed - Last BP in normal range    BP Readings from Last 1 Encounters:  02/17/23 136/82

## 2023-02-23 ENCOUNTER — Encounter: Payer: Self-pay | Admitting: Internal Medicine

## 2023-02-23 LAB — SPECIMEN STATUS REPORT

## 2023-02-23 LAB — PAP LB (LIQUID-BASED)

## 2023-04-19 ENCOUNTER — Other Ambulatory Visit: Payer: Self-pay | Admitting: Internal Medicine

## 2023-04-19 DIAGNOSIS — F419 Anxiety disorder, unspecified: Secondary | ICD-10-CM

## 2023-04-20 ENCOUNTER — Encounter: Payer: Self-pay | Admitting: Internal Medicine

## 2023-04-20 DIAGNOSIS — F419 Anxiety disorder, unspecified: Secondary | ICD-10-CM

## 2023-04-20 NOTE — Telephone Encounter (Signed)
Requested Prescriptions  Pending Prescriptions Disp Refills   venlafaxine XR (EFFEXOR-XR) 75 MG 24 hr capsule [Pharmacy Med Name: VENLAFAXINE ER 75MG  CAPSULES] 90 capsule 0    Sig: TAKE 1 CAPSULE(75 MG) BY MOUTH DAILY WITH BREAKFAST     Psychiatry: Antidepressants - SNRI - desvenlafaxine & venlafaxine Failed - 04/19/2023  8:48 AM      Failed - Lipid Panel in normal range within the last 12 months    Cholesterol, Total  Date Value Ref Range Status  02/17/2023 193 100 - 199 mg/dL Final   LDL Chol Calc (NIH)  Date Value Ref Range Status  02/17/2023 107 (H) 0 - 99 mg/dL Final   HDL  Date Value Ref Range Status  02/17/2023 75 >39 mg/dL Final   Triglycerides  Date Value Ref Range Status  02/17/2023 57 0 - 149 mg/dL Final         Passed - Cr in normal range and within 360 days    Creatinine, Ser  Date Value Ref Range Status  02/17/2023 0.83 0.57 - 1.00 mg/dL Final         Passed - Last BP in normal range    BP Readings from Last 1 Encounters:  02/17/23 136/82         Passed - Valid encounter within last 6 months    Recent Outpatient Visits           2 months ago Encounter for general adult medical examination with abnormal findings   [place] [facility] [place], Salvadore Oxford, NP   8 months ago Class 1 obesity due to excess calories with serious comorbidity and body mass index (BMI) of 34.0 to 34.9 in adult   [facility] [place], Salvadore Oxford, NP   1 year ago Encounter for general adult medical examination with abnormal findings   [facility] [place], Salvadore Oxford, NP   2 years ago Encounter for general adult medical examination with abnormal findings   [place] [facility] [place], Salvadore Oxford, NP       Future Appointments             In 4 months Baity, Salvadore Oxford, NP [facility]

## 2023-04-22 MED ORDER — VENLAFAXINE HCL ER 75 MG PO CP24
75.0000 mg | ORAL_CAPSULE | Freq: Every day | ORAL | 1 refills | Status: DC
Start: 2023-04-22 — End: 2023-08-22

## 2023-04-22 NOTE — Addendum Note (Signed)
Addended by: Kavin Leech E on: 04/22/2023 10:41 AM   Modules accepted: Orders

## 2023-07-11 ENCOUNTER — Other Ambulatory Visit: Payer: Self-pay | Admitting: Internal Medicine

## 2023-07-11 DIAGNOSIS — Z1231 Encounter for screening mammogram for malignant neoplasm of breast: Secondary | ICD-10-CM

## 2023-08-01 ENCOUNTER — Encounter: Payer: Self-pay | Admitting: Internal Medicine

## 2023-08-01 ENCOUNTER — Other Ambulatory Visit: Payer: Self-pay

## 2023-08-01 MED ORDER — MINOCYCLINE HCL 50 MG PO CAPS: 50.0000 mg | ORAL_CAPSULE | Freq: Two times a day (BID) | ORAL | 1 refills | Status: DC

## 2023-08-08 ENCOUNTER — Ambulatory Visit
Admission: RE | Admit: 2023-08-08 | Discharge: 2023-08-08 | Disposition: A | Discharge status: Home / Self Care | Payer: Managed Care, Other (non HMO) | Source: Ambulatory Visit | Attending: Internal Medicine | Admitting: Internal Medicine

## 2023-08-08 DIAGNOSIS — Z1231 Encounter for screening mammogram for malignant neoplasm of breast: Secondary | ICD-10-CM | POA: Insufficient documentation

## 2023-08-22 ENCOUNTER — Ambulatory Visit: Payer: Managed Care, Other (non HMO) | Admitting: Internal Medicine

## 2023-08-22 ENCOUNTER — Encounter: Payer: Self-pay | Admitting: Internal Medicine

## 2023-08-22 VITALS — BP 122/78 | Ht 63.0 in | Wt 187.4 lb

## 2023-08-22 DIAGNOSIS — F5101 Primary insomnia: Secondary | ICD-10-CM

## 2023-08-22 DIAGNOSIS — L709 Acne, unspecified: Secondary | ICD-10-CM

## 2023-08-22 DIAGNOSIS — Z6833 Body mass index (BMI) 33.0-33.9, adult: Secondary | ICD-10-CM

## 2023-08-22 DIAGNOSIS — E7800 Pure hypercholesterolemia, unspecified: Secondary | ICD-10-CM

## 2023-08-22 DIAGNOSIS — E66811 Obesity, class 1: Secondary | ICD-10-CM | POA: Diagnosis not present

## 2023-08-22 DIAGNOSIS — F419 Anxiety disorder, unspecified: Secondary | ICD-10-CM

## 2023-08-22 DIAGNOSIS — E6609 Other obesity due to excess calories: Secondary | ICD-10-CM

## 2023-08-22 MED ORDER — VENLAFAXINE HCL ER 75 MG PO CP24: 75.0000 mg | ORAL_CAPSULE | Freq: Every day | ORAL | 1 refills | Status: DC

## 2023-08-22 MED ORDER — TRAZODONE HCL 50 MG PO TABS: ORAL_TABLET | ORAL | 1 refills | Status: DC

## 2023-08-22 NOTE — Patient Instructions (Signed)
Calorie Counting for Weight Loss Calories are units of energy. Your body needs a certain number of calories from food to keep going throughout the day. When you eat or drink more calories than your body needs, your body stores the extra calories mostly as fat. When you eat or drink fewer calories than your body needs, your body burns fat to get the energy it needs. Calorie counting means keeping track of how many calories you eat and drink each day. Calorie counting can be helpful if you need to lose weight. If you eat fewer calories than your body needs, you should lose weight. Ask your health care provider what a healthy weight is for you. For calorie counting to work, you will need to eat the right number of calories each day to lose a healthy amount of weight per week. A dietitian can help you figure out how many calories you need in a day and will suggest ways to reach your calorie goal. A healthy amount of weight to lose each week is usually 1-2 lb (0.5-0.9 kg). This usually means that your daily calorie intake should be reduced by 500-750 calories. Eating 1,200-1,500 calories a day can help most women lose weight. Eating 1,500-1,800 calories a day can help most men lose weight. What do I need to know about calorie counting? Work with your health care provider or dietitian to determine how many calories you should get each day. To meet your daily calorie goal, you will need to: Find out how many calories are in each food that you would like to eat. Try to do this before you eat. Decide how much of the food you plan to eat. Keep a food log. Do this by writing down what you ate and how many calories it had. To successfully lose weight, it is important to balance calorie counting with a healthy lifestyle that includes regular activity. Where do I find calorie information?  The number of calories in a food can be found on a Nutrition Facts label. If a food does not have a Nutrition Facts label, try  to look up the calories online or ask your dietitian for help. Remember that calories are listed per serving. If you choose to have more than one serving of a food, you will have to multiply the calories per serving by the number of servings you plan to eat. For example, the label on a package of bread might say that a serving size is 1 slice and that there are 90 calories in a serving. If you eat 1 slice, you will have eaten 90 calories. If you eat 2 slices, you will have eaten 180 calories. How do I keep a food log? After each time that you eat, record the following in your food log as soon as possible: What you ate. Be sure to include toppings, sauces, and other extras on the food. How much you ate. This can be measured in cups, ounces, or number of items. How many calories were in each food and drink. The total number of calories in the food you ate. Keep your food log near you, such as in a pocket-sized notebook or on an app or website on your mobile phone. Some programs will calculate calories for you and show you how many calories you have left to meet your daily goal. What are some portion-control tips? Know how many calories are in a serving. This will help you know how many servings you can have of a certain   food. Use a measuring cup to measure serving sizes. You could also try weighing out portions on a kitchen scale. With time, you will be able to estimate serving sizes for some foods. Take time to put servings of different foods on your favorite plates or in your favorite bowls and cups so you know what a serving looks like. Try not to eat straight from a food's packaging, such as from a bag or box. Eating straight from the package makes it hard to see how much you are eating and can lead to overeating. Put the amount you would like to eat in a cup or on a plate to make sure you are eating the right portion. Use smaller plates, glasses, and bowls for smaller portions and to prevent  overeating. Try not to multitask. For example, avoid watching TV or using your computer while eating. If it is time to eat, sit down at a table and enjoy your food. This will help you recognize when you are full. It will also help you be more mindful of what and how much you are eating. What are tips for following this plan? Reading food labels Check the calorie count compared with the serving size. The serving size may be smaller than what you are used to eating. Check the source of the calories. Try to choose foods that are high in protein, fiber, and vitamins, and low in saturated fat, trans fat, and sodium. Shopping Read nutrition labels while you shop. This will help you make healthy decisions about which foods to buy. Pay attention to nutrition labels for low-fat or fat-free foods. These foods sometimes have the same number of calories or more calories than the full-fat versions. They also often have added sugar, starch, or salt to make up for flavor that was removed with the fat. Make a grocery list of lower-calorie foods and stick to it. Cooking Try to cook your favorite foods in a healthier way. For example, try baking instead of frying. Use low-fat dairy products. Meal planning Use more fruits and vegetables. One-half of your plate should be fruits and vegetables. Include lean proteins, such as chicken, turkey, and fish. Lifestyle Each week, aim to do one of the following: 150 minutes of moderate exercise, such as walking. 75 minutes of vigorous exercise, such as running. General information Know how many calories are in the foods you eat most often. This will help you calculate calorie counts faster. Find a way of tracking calories that works for you. Get creative. Try different apps or programs if writing down calories does not work for you. What foods should I eat?  Eat nutritious foods. It is better to have a nutritious, high-calorie food, such as an avocado, than a food with  few nutrients, such as a bag of potato chips. Use your calories on foods and drinks that will fill you up and will not leave you hungry soon after eating. Examples of foods that fill you up are nuts and nut butters, vegetables, lean proteins, and high-fiber foods such as whole grains. High-fiber foods are foods with more than 5 g of fiber per serving. Pay attention to calories in drinks. Low-calorie drinks include water and unsweetened drinks. The items listed above may not be a complete list of foods and beverages you can eat. Contact a dietitian for more information. What foods should I limit? Limit foods or drinks that are not good sources of vitamins, minerals, or protein or that are high in unhealthy fats. These   include: Candy. Other sweets. Sodas, specialty coffee drinks, alcohol, and juice. The items listed above may not be a complete list of foods and beverages you should avoid. Contact a dietitian for more information. How do I count calories when eating out? Pay attention to portions. Often, portions are much larger when eating out. Try these tips to keep portions smaller: Consider sharing a meal instead of getting your own. If you get your own meal, eat only half of it. Before you start eating, ask for a container and put half of your meal into it. When available, consider ordering smaller portions from the menu instead of full portions. Pay attention to your food and drink choices. Knowing the way food is cooked and what is included with the meal can help you eat fewer calories. If calories are listed on the menu, choose the lower-calorie options. Choose dishes that include vegetables, fruits, whole grains, low-fat dairy products, and lean proteins. Choose items that are boiled, broiled, grilled, or steamed. Avoid items that are buttered, battered, fried, or served with cream sauce. Items labeled as crispy are usually fried, unless stated otherwise. Choose water, low-fat milk,  unsweetened iced tea, or other drinks without added sugar. If you want an alcoholic beverage, choose a lower-calorie option, such as a glass of wine or light beer. Ask for dressings, sauces, and syrups on the side. These are usually high in calories, so you should limit the amount you eat. If you want a salad, choose a garden salad and ask for grilled meats. Avoid extra toppings such as bacon, cheese, or fried items. Ask for the dressing on the side, or ask for olive oil and vinegar or lemon to use as dressing. Estimate how many servings of a food you are given. Knowing serving sizes will help you be aware of how much food you are eating at restaurants. Where to find more information Centers for Disease Control and Prevention: www.cdc.gov [place] Department of Agriculture: myplate.gov Summary Calorie counting means keeping track of how many calories you eat and drink each day. If you eat fewer calories than your body needs, you should lose weight. A healthy amount of weight to lose per week is usually 1-2 lb (0.5-0.9 kg). This usually means reducing your daily calorie intake by 500-750 calories. The number of calories in a food can be found on a Nutrition Facts label. If a food does not have a Nutrition Facts label, try to look up the calories online or ask your dietitian for help. Use smaller plates, glasses, and bowls for smaller portions and to prevent overeating. Use your calories on foods and drinks that will fill you up and not leave you hungry shortly after a meal. This information is not intended to replace advice given to you by your health care provider. Make sure you discuss any questions you have with your health care provider. Document Revised: 09/20/2019 Document Reviewed: 09/20/2019 Elsevier Patient Education  2023 Elsevier Inc.  

## 2023-08-22 NOTE — Assessment & Plan Note (Signed)
Lipid profile will be checked with annual exam Encouraged her to consume a low-fat diet

## 2023-08-22 NOTE — Progress Notes (Signed)
Subjective:    Patient ID: Maria Thornton, female    DOB: 27-Nov-1970, 52 y.o.   MRN: [id number]  HPI  Patient presents to clinic today for follow-up of chronic conditions.   Anxiety: Chronic, managed on venlafaxine.  She is not currently seeing a therapist.  She denies depression, SI/HI.   Insomnia: She has difficulty staying asleep.  She is taking trazodone as prescribed with good relief of symptoms.  There is no sleep study on file.   HLD: Her last LDL was 107, triglycerides 57, 01/2023.  She is not taking any cholesterol-lowering medication at this time.  She tries to consume a low-fat diet.  Acne:  Managed with minocycline. She follow with dermatology.  Review of Systems     Past Medical History:  Diagnosis Date   Heart murmur    no proph   History of asthma    in the past, very mild   History of Bell's palsy    years ago   Multiple allergies     Current Outpatient Medications  Medication Sig Dispense Refill   Ascorbic Acid (VITAMIN C) 1000 MG tablet      fluticasone (FLONASE) 50 MCG/ACT nasal spray 2 sprays each nostril once daily 2 sprays each nostril once daily 16 g 3   Magnesium Citrate 200 MG TABS Take by mouth.     minocycline (MINOCIN) 50 MG capsule Take 1 capsule (50 mg total) by mouth 2 (two) times daily. 90 capsule 1   Naltrexone-buPROPion HCl ER 8-90 MG TB12 Start 1 tablet every morning for 7 days, then 1 tablet twice daily for 7 days, then 2 tablets every morning and one in the evening 120 tablet 1   traZODone (DESYREL) 50 MG tablet TAKE 1 TO 1 AND 1/2 TABLETS( 50 TO 75 MG TOTAL) BY MOUTH AT BEDTIME AS NEEDED FOR SLEEP 135 tablet 0   venlafaxine XR (EFFEXOR-XR) 75 MG 24 hr capsule Take 1 capsule (75 mg total) by mouth daily with breakfast. 90 capsule 1   No current facility-administered medications for this visit.    Allergies  Allergen Reactions   Sulfonamide Derivatives     REACTION: rash    Family History  Problem Relation Age of Onset    Arthritis Mother        rheumatoid   Hypertension Mother    COPD Mother    Hypertension Father    COPD Father    Hypertension Sister    Diabetes Maternal Grandmother    Asthma Maternal Grandmother    Arthritis Maternal Grandfather        rheumatoid   Hyperlipidemia Maternal Grandfather    Hypertension Maternal Grandfather    Arthritis Paternal Grandmother    Breast cancer Maternal Aunt 53       Invasive Lobular Carcinoma    Healthy Brother     Social History   Socioeconomic History   Marital status: Married    Spouse name: Not on file   Number of children: 2   Years of education: Not on file   Highest education level: Bachelor's degree (e.g., BA, AB, BS)  Occupational History   Occupation: Event organiser: [employer]  Tobacco Use   Smoking status: Never   Smokeless tobacco: Never  Vaping Use   Vaping status: Never Used  Substance and Sexual Activity   Alcohol use: Yes   Drug use: No   Sexual activity: Not on file  Other Topics Concern   Not on file  Social History Narrative   No regular exercise.   Social Drivers of Corporate investment banker Strain: Low Risk  (08/18/2023)   Overall Financial Resource Strain (CARDIA)    Difficulty of Paying Living Expenses: Not hard at all  Food Insecurity: No Food Insecurity (08/18/2023)   Hunger Vital Sign    Worried About Running Out of Food in the Last Year: Never true    Ran Out of Food in the Last Year: Never true  Transportation Needs: No Transportation Needs (08/18/2023)   PRAPARE - Administrator, Civil Service (Medical): No    Lack of Transportation (Non-Medical): No  Physical Activity: Insufficiently Active (08/18/2023)   Exercise Vital Sign    Days of Exercise per Week: 3 days    Minutes of Exercise per Session: 30 min  Stress: Stress Concern Present (08/18/2023)   [employer] of Occupational Health - Occupational Stress Questionnaire    Feeling of Stress : To some extent  Social  Connections: Moderately Isolated (08/18/2023)   Social Connection and Isolation Panel [NHANES]    Frequency of Communication with Friends and Family: More than three times a week    Frequency of Social Gatherings with Friends and Family: Twice a week    Attends Religious Services: Never    Database administrator or Organizations: No    Attends Engineer, structural: Not on file    Marital Status: Married  Catering manager Violence: Not on file     Constitutional: Denies denies fever, malaise, fatigue, headache or abrupt weight changes.  HEENT: Denies eye pain, eye redness, ear pain, ringing in the ears, wax buildup, runny nose, nasal congestion, bloody nose, or sore throat. Respiratory: Denies difficulty breathing, shortness of breath, cough or sputum production.   Cardiovascular: Patient reports intermittent swelling in legs.  Denies chest pain, chest tightness, palpitations or swelling in the hands.  Gastrointestinal: Denies abdominal pain, bloating, constipation, diarrhea or blood in the stool.  GU: Denies urgency, frequency, pain with urination, burning sensation, blood in urine, odor or discharge. Musculoskeletal: Denies decrease in range of motion, difficulty with gait, muscle pain or joint pain and swelling.  Skin: Pt reports acne. Denies redness, rashes, or ulcercations.  Neurological: Patient reports insomnia.  Denies dizziness, difficulty with memory, difficulty with speech or problems with balance and coordination.  Psych: Patient is a history of anxiety.  Denies depression, SI/HI.  No other specific complaints in a complete review of systems (except as listed in HPI above).  Objective:   Physical Exam  BP 122/78 (BP Location: Left Arm, Patient Position: Sitting, Cuff Size: Normal)   Ht 5\' 3"  (1.6 m)   Wt 187 lb 6.4 oz (85 kg)   LMP 03/20/2018   BMI 33.20 kg/m    Wt Readings from Last 3 Encounters:  02/17/23 182 lb (82.6 kg)  08/19/22 189 lb 3.2 oz (85.8 kg)   02/11/22 173 lb (78.5 kg)    General: Appears her stated age, obese in NAD. Skin: Warm, dry and intact.  Cardiovascular: Normal rate and rhythm. S1,S2 noted.  No murmur, rubs or gallops noted.  Pulmonary/Chest: Normal effort and positive vesicular breath sounds. No respiratory distress. No wheezes, rales or ronchi noted.  Musculoskeletal: No difficulty with gait.  Neurological: Alert and oriented. Coordination normal.     BMET    Component Value Date/Time   NA 139 02/17/2023 0928   K 4.8 02/17/2023 0928   CL 102 02/17/2023 0928   CO2 26 02/17/2023  1610   GLUCOSE 101 (H) 02/17/2023 0928   BUN 17 02/17/2023 0928   CREATININE 0.83 02/17/2023 0928   CALCIUM 10.0 02/17/2023 0928   GFRNONAA 78 02/05/2020 0938   GFRAA 90 02/05/2020 0938    Lipid Panel     Component Value Date/Time   CHOL 193 02/17/2023 0928   TRIG 57 02/17/2023 0928   HDL 75 02/17/2023 0928   CHOLHDL 2.6 02/17/2023 0928   LDLCALC 107 (H) 02/17/2023 0928    CBC    Component Value Date/Time   WBC 4.5 02/17/2023 0928   RBC 4.51 02/17/2023 0928   HGB 14.2 02/17/2023 0928   HCT 42.4 02/17/2023 0928   PLT 201 02/17/2023 0928   MCV 94 02/17/2023 0928   MCH 31.5 02/17/2023 0928   MCHC 33.5 02/17/2023 0928   RDW 12.5 02/17/2023 0928   LYMPHSABS 1.7 04/01/2016 0000   EOSABS 0.3 04/01/2016 0000   BASOSABS 0.1 04/01/2016 0000    Hgb A1C Lab Results  Component Value Date   HGBA1C 5.5 02/17/2023            Assessment & Plan:     RTC in 6 months for your annual exam Nicki Reaper, NP

## 2023-08-22 NOTE — Assessment & Plan Note (Signed)
Continue minocycline 

## 2023-08-22 NOTE — Assessment & Plan Note (Signed)
Continue trazodone We will monitor

## 2023-08-22 NOTE — Assessment & Plan Note (Signed)
Stable on her current dose of venlafaxine Support offered

## 2023-08-22 NOTE — Assessment & Plan Note (Signed)
Will try Wegovy, sample provided

## 2023-09-07 ENCOUNTER — Encounter: Payer: Self-pay | Admitting: Internal Medicine

## 2023-10-20 ENCOUNTER — Encounter: Payer: Self-pay | Admitting: Internal Medicine

## 2023-10-25 ENCOUNTER — Other Ambulatory Visit: Payer: Self-pay | Admitting: Internal Medicine

## 2023-10-25 DIAGNOSIS — F419 Anxiety disorder, unspecified: Secondary | ICD-10-CM

## 2023-10-26 ENCOUNTER — Other Ambulatory Visit: Payer: Self-pay

## 2023-10-26 ENCOUNTER — Encounter: Payer: Self-pay | Admitting: Internal Medicine

## 2023-10-26 DIAGNOSIS — F419 Anxiety disorder, unspecified: Secondary | ICD-10-CM

## 2023-10-26 MED ORDER — VENLAFAXINE HCL ER 75 MG PO CP24: 75.0000 mg | ORAL_CAPSULE | Freq: Every day | ORAL | 1 refills | Status: AC

## 2023-10-26 NOTE — Telephone Encounter (Signed)
 Rx 08/22/23 #90 1RF- too soon Requested Prescriptions  Pending Prescriptions Disp Refills   venlafaxine XR (EFFEXOR-XR) 75 MG 24 hr capsule [Pharmacy Med Name: VENLAFAXINE ER 75MG  CAPSULES] 90 capsule 1    Sig: TAKE 1 CAPSULE(75 MG) BY MOUTH DAILY WITH BREAKFAST     Psychiatry: Antidepressants - SNRI - desvenlafaxine & venlafaxine Failed - 10/26/2023  1:06 PM      Failed - Lipid Panel in normal range within the last 12 months    Cholesterol, Total  Date Value Ref Range Status  02/17/2023 193 100 - 199 mg/dL Final   LDL Chol Calc (NIH)  Date Value Ref Range Status  02/17/2023 107 (H) 0 - 99 mg/dL Final   HDL  Date Value Ref Range Status  02/17/2023 75 >39 mg/dL Final   Triglycerides  Date Value Ref Range Status  02/17/2023 57 0 - 149 mg/dL Final         Passed - Cr in normal range and within 360 days    Creatinine, Ser  Date Value Ref Range Status  02/17/2023 0.83 0.57 - 1.00 mg/dL Final         Passed - Last BP in normal range    BP Readings from Last 1 Encounters:  08/22/23 122/78         Passed - Valid encounter within last 6 months    Recent Outpatient Visits           2 months ago Pure hypercholesterolemia   [facility] [place], Salvadore Oxford, NP   8 months ago Encounter for general adult medical examination with abnormal findings   [facility] [place], [place] W, NP   1 year ago Class 1 obesity due to excess calories with serious comorbidity and body mass index (BMI) of 34.0 to 34.9 in adult   [facility] [place], Salvadore Oxford, NP   1 year ago Encounter for general adult medical examination with abnormal findings   [place] [facility] [place], Salvadore Oxford, NP   2 years ago Encounter for general adult medical examination with abnormal findings   [facility] [place], Salvadore Oxford, NP       Future Appointments             In 3 months Baity, Salvadore Oxford, NP  [facility]

## 2023-10-31 DIAGNOSIS — M9902 Segmental and somatic dysfunction of thoracic region: Secondary | ICD-10-CM | POA: Diagnosis not present

## 2023-10-31 DIAGNOSIS — M5414 Radiculopathy, thoracic region: Secondary | ICD-10-CM | POA: Diagnosis not present

## 2023-10-31 DIAGNOSIS — M9901 Segmental and somatic dysfunction of cervical region: Secondary | ICD-10-CM | POA: Diagnosis not present

## 2023-10-31 DIAGNOSIS — M5033 Other cervical disc degeneration, cervicothoracic region: Secondary | ICD-10-CM | POA: Diagnosis not present

## 2023-11-02 DIAGNOSIS — M9901 Segmental and somatic dysfunction of cervical region: Secondary | ICD-10-CM | POA: Diagnosis not present

## 2023-11-02 DIAGNOSIS — M5033 Other cervical disc degeneration, cervicothoracic region: Secondary | ICD-10-CM | POA: Diagnosis not present

## 2023-11-02 DIAGNOSIS — M5414 Radiculopathy, thoracic region: Secondary | ICD-10-CM | POA: Diagnosis not present

## 2023-11-02 DIAGNOSIS — M9902 Segmental and somatic dysfunction of thoracic region: Secondary | ICD-10-CM | POA: Diagnosis not present

## 2023-11-08 DIAGNOSIS — M9902 Segmental and somatic dysfunction of thoracic region: Secondary | ICD-10-CM | POA: Diagnosis not present

## 2023-11-08 DIAGNOSIS — M5033 Other cervical disc degeneration, cervicothoracic region: Secondary | ICD-10-CM | POA: Diagnosis not present

## 2023-11-08 DIAGNOSIS — M5414 Radiculopathy, thoracic region: Secondary | ICD-10-CM | POA: Diagnosis not present

## 2023-11-08 DIAGNOSIS — M9901 Segmental and somatic dysfunction of cervical region: Secondary | ICD-10-CM | POA: Diagnosis not present

## 2023-11-14 DIAGNOSIS — M9901 Segmental and somatic dysfunction of cervical region: Secondary | ICD-10-CM | POA: Diagnosis not present

## 2023-11-14 DIAGNOSIS — M9902 Segmental and somatic dysfunction of thoracic region: Secondary | ICD-10-CM | POA: Diagnosis not present

## 2023-11-14 DIAGNOSIS — M5033 Other cervical disc degeneration, cervicothoracic region: Secondary | ICD-10-CM | POA: Diagnosis not present

## 2023-11-14 DIAGNOSIS — M5414 Radiculopathy, thoracic region: Secondary | ICD-10-CM | POA: Diagnosis not present

## 2023-12-01 DIAGNOSIS — M9902 Segmental and somatic dysfunction of thoracic region: Secondary | ICD-10-CM | POA: Diagnosis not present

## 2023-12-01 DIAGNOSIS — M9901 Segmental and somatic dysfunction of cervical region: Secondary | ICD-10-CM | POA: Diagnosis not present

## 2023-12-01 DIAGNOSIS — M5414 Radiculopathy, thoracic region: Secondary | ICD-10-CM | POA: Diagnosis not present

## 2023-12-01 DIAGNOSIS — M5033 Other cervical disc degeneration, cervicothoracic region: Secondary | ICD-10-CM | POA: Diagnosis not present

## 2024-01-02 DIAGNOSIS — M9902 Segmental and somatic dysfunction of thoracic region: Secondary | ICD-10-CM | POA: Diagnosis not present

## 2024-01-02 DIAGNOSIS — M9901 Segmental and somatic dysfunction of cervical region: Secondary | ICD-10-CM | POA: Diagnosis not present

## 2024-01-02 DIAGNOSIS — M5414 Radiculopathy, thoracic region: Secondary | ICD-10-CM | POA: Diagnosis not present

## 2024-01-02 DIAGNOSIS — M5033 Other cervical disc degeneration, cervicothoracic region: Secondary | ICD-10-CM | POA: Diagnosis not present

## 2024-01-31 DIAGNOSIS — M9901 Segmental and somatic dysfunction of cervical region: Secondary | ICD-10-CM | POA: Diagnosis not present

## 2024-01-31 DIAGNOSIS — M5033 Other cervical disc degeneration, cervicothoracic region: Secondary | ICD-10-CM | POA: Diagnosis not present

## 2024-01-31 DIAGNOSIS — M9902 Segmental and somatic dysfunction of thoracic region: Secondary | ICD-10-CM | POA: Diagnosis not present

## 2024-01-31 DIAGNOSIS — M5414 Radiculopathy, thoracic region: Secondary | ICD-10-CM | POA: Diagnosis not present

## 2024-02-20 ENCOUNTER — Encounter: Payer: Self-pay | Admitting: Internal Medicine

## 2024-02-20 ENCOUNTER — Ambulatory Visit (INDEPENDENT_AMBULATORY_CARE_PROVIDER_SITE_OTHER): Payer: Self-pay | Admitting: Internal Medicine

## 2024-02-20 VITALS — BP 120/72 | Ht 63.0 in | Wt 164.0 lb

## 2024-02-20 DIAGNOSIS — Z0001 Encounter for general adult medical examination with abnormal findings: Secondary | ICD-10-CM

## 2024-02-20 DIAGNOSIS — E7800 Pure hypercholesterolemia, unspecified: Secondary | ICD-10-CM

## 2024-02-20 DIAGNOSIS — M25551 Pain in right hip: Secondary | ICD-10-CM | POA: Diagnosis not present

## 2024-02-20 DIAGNOSIS — E663 Overweight: Secondary | ICD-10-CM | POA: Diagnosis not present

## 2024-02-20 DIAGNOSIS — Z6829 Body mass index (BMI) 29.0-29.9, adult: Secondary | ICD-10-CM | POA: Diagnosis not present

## 2024-02-20 DIAGNOSIS — G8929 Other chronic pain: Secondary | ICD-10-CM | POA: Diagnosis not present

## 2024-02-20 MED ORDER — NAPROXEN 500 MG PO TABS: 500.0000 mg | ORAL_TABLET | Freq: Two times a day (BID) | ORAL | 0 refills | Status: DC

## 2024-02-20 MED ORDER — METRONIDAZOLE 1 % EX GEL: Freq: Every day | CUTANEOUS | 0 refills | Status: AC

## 2024-02-20 NOTE — Assessment & Plan Note (Signed)
 Encouraged diet and exercise for weight loss ?

## 2024-02-20 NOTE — Progress Notes (Signed)
 Subjective:    Patient ID: Maria Thornton, female    DOB: [birth year], 53 y.o.   MRN: [id number]  HPI  Patient presents to clinic today for her annual exam.  She also reports right hip pain.  She reports this started a few months ago after a fall where she landed directly on her hip.  She reports the pain has improved slightly but she continues to have intermittent pain.  She has been seeing a chiropractor with minimal improvement in symptoms.  Flu: 05/2023 Tetanus: 01/2020 COVID: Pfizer x 2 Shingrix: 02/2021 Pap smear: 01/2023 Mammogram: 07/2023 Colon screening: 03/2020 Vision screening: annually Dentist: biannually  Diet: She does eat meat. She consumes fruits and veggies. She tries to avoid fried foods. She drinks mostly water. Exercise: treadmill, rowing machine, weights  Review of Systems  Past Medical History:  Diagnosis Date   Heart murmur    no proph   History of asthma    in the past, very mild   History of Bell's palsy    years ago   Multiple allergies     Current Outpatient Medications  Medication Sig Dispense Refill   Ascorbic Acid (VITAMIN C) 1000 MG tablet      fluticasone  (FLONASE ) 50 MCG/ACT nasal spray 2 sprays each nostril once daily 2 sprays each nostril once daily 16 g 3   Magnesium Citrate 200 MG TABS Take by mouth.     minocycline  (MINOCIN ) 50 MG capsule Take 1 capsule (50 mg total) by mouth 2 (two) times daily. 90 capsule 1   traZODone  (DESYREL ) 50 MG tablet TAKE 1 TO 1 AND 1/2 TABLETS( 50 TO 75 MG TOTAL) BY MOUTH AT BEDTIME AS NEEDED FOR SLEEP 135 tablet 1   venlafaxine  XR (EFFEXOR -XR) 75 MG 24 hr capsule Take 1 capsule (75 mg total) by mouth daily with breakfast. 90 capsule 1   No current facility-administered medications for this visit.    Allergies  Allergen Reactions   Sulfonamide Derivatives     REACTION: rash    Family History  Problem Relation Age of Onset   Arthritis Mother        rheumatoid   Hypertension Mother    COPD Mother     Hypertension Father    COPD Father    Diabetes Father        type 2   Hyperlipidemia Father    Hypertension Sister    Healthy Brother    Diabetes Maternal Grandmother    Asthma Maternal Grandmother    Arthritis Maternal Grandfather        rheumatoid   Hyperlipidemia Maternal Grandfather    Hypertension Maternal Grandfather    Arthritis Paternal Grandmother    Breast cancer Maternal Aunt 53       Invasive Lobular Carcinoma     Social History   Socioeconomic History   Marital status: Married    Spouse name: Not on file   Number of children: 2   Years of education: Not on file   Highest education level: Bachelor's degree (e.g., BA, AB, BS)  Occupational History   Occupation: Event organiser: [employer]  Tobacco Use   Smoking status: Never   Smokeless tobacco: Never  Vaping Use   Vaping status: Never Used  Substance and Sexual Activity   Alcohol use: Yes   Drug use: No   Sexual activity: Not on file  Other Topics Concern   Not on file  Social History Narrative   No regular exercise.  Social Drivers of Corporate investment banker Strain: Low Risk  (02/19/2024)   Overall Financial Resource Strain (CARDIA)    Difficulty of Paying Living Expenses: Not hard at all  Food Insecurity: No Food Insecurity (02/19/2024)   Hunger Vital Sign    Worried About Running Out of Food in the Last Year: Never true    Ran Out of Food in the Last Year: Never true  Transportation Needs: No Transportation Needs (02/19/2024)   PRAPARE - Administrator, Civil Service (Medical): No    Lack of Transportation (Non-Medical): No  Physical Activity: Insufficiently Active (02/19/2024)   Exercise Vital Sign    Days of Exercise per Week: 2 days    Minutes of Exercise per Session: 30 min  Stress: No Stress Concern Present (02/19/2024)   [employer] of Occupational Health - Occupational Stress Questionnaire    Feeling of Stress: Only a little  Social Connections: Moderately  Isolated (02/19/2024)   Social Connection and Isolation Panel    Frequency of Communication with Friends and Family: More than three times a week    Frequency of Social Gatherings with Friends and Family: Twice a week    Attends Religious Services: Never    Database administrator or Organizations: No    Attends Engineer, structural: Not on file    Marital Status: Married  Catering manager Violence: Not on file     Constitutional: Denies fever, malaise, fatigue, headache or abrupt weight changes.  HEENT: Denies eye pain, eye redness, ear pain, ringing in the ears, wax buildup, runny nose, nasal congestion, bloody nose, or sore throat. Respiratory: Denies difficulty breathing, shortness of breath, cough or sputum production.   Cardiovascular: Denies chest pain, chest tightness, palpitations or swelling in the hands or feet.  Gastrointestinal: Patient reports intermittent constipation.  Denies abdominal pain, bloating, diarrhea or blood in the stool.  GU: Denies urgency, frequency, pain with urination, burning sensation, blood in urine, odor or discharge. Musculoskeletal: Pt reports right hip pain. Denies decrease in range of motion, difficulty with gait, muscle pain or joint swelling.  Skin: Denies redness, rashes, lesions or ulcercations.  Neurological: Patient reports insomnia.  Denies dizziness, difficulty with memory, difficulty with speech or problems with balance and coordination.  Psych: Patient has a history of anxiety.  Denies depression, SI/HI.  No other specific complaints in a complete review of systems (except as listed in HPI above).     Objective:   Physical Exam  BP 120/72 (BP Location: Left Arm, Patient Position: Sitting, Cuff Size: Normal)   Ht 5' 3 (1.6 m)   Wt 164 lb (74.4 kg)   LMP 03/20/2018   BMI 29.05 kg/m    Wt Readings from Last 3 Encounters:  08/22/23 187 lb 6.4 oz (85 kg)  02/17/23 182 lb (82.6 kg)  08/19/22 189 lb 3.2 oz (85.8 kg)     General: Appears her stated age, overweight, in NAD. Skin: Warm, dry and intact.  HEENT: Head: normal shape and size; Eyes: sclera white, no icterus, conjunctiva pink, PERRLA and EOMs intact;  Neck:  Neck supple, trachea midline. No masses, lumps or thyromegaly present.  Cardiovascular: Normal rate and rhythm. S1,S2 noted.  No murmur, rubs or gallops noted. No JVD or BLE edema. No carotid bruits noted. Pulmonary/Chest: Normal effort and positive vesicular breath sounds. No respiratory distress. No wheezes, rales or ronchi noted.  Abdomen: Soft and nontender. Normal bowel sounds.  Musculoskeletal: Normal abduction, adduction, internal and external rotation  of the right shoulder.  Pain with palpation over the right trochanter.  Strength 5/5 BUE/BLE. No difficulty with gait.  Neurological: Alert and oriented. Cranial nerves II-XII grossly intact. Coordination normal.  Psychiatric: Mood and affect normal. Behavior is normal. Judgment and thought content normal.    BMET    Component Value Date/Time   NA 139 02/17/2023 0928   K 4.8 02/17/2023 0928   CL 102 02/17/2023 0928   CO2 26 02/17/2023 0928   GLUCOSE 101 (H) 02/17/2023 0928   BUN 17 02/17/2023 0928   CREATININE 0.83 02/17/2023 0928   CALCIUM 10.0 02/17/2023 0928   GFRNONAA 78 02/05/2020 0938   GFRAA 90 02/05/2020 0938    Lipid Panel     Component Value Date/Time   CHOL 193 02/17/2023 0928   TRIG 57 02/17/2023 0928   HDL 75 02/17/2023 0928   CHOLHDL 2.6 02/17/2023 0928   LDLCALC 107 (H) 02/17/2023 0928    CBC    Component Value Date/Time   WBC 4.5 02/17/2023 0928   RBC 4.51 02/17/2023 0928   HGB 14.2 02/17/2023 0928   HCT 42.4 02/17/2023 0928   PLT 201 02/17/2023 0928   MCV 94 02/17/2023 0928   MCH 31.5 02/17/2023 0928   MCHC 33.5 02/17/2023 0928   RDW 12.5 02/17/2023 0928   LYMPHSABS 1.7 04/01/2016 0000   EOSABS 0.3 04/01/2016 0000   BASOSABS 0.1 04/01/2016 0000    Hgb A1C Lab Results  Component Value  Date   HGBA1C 5.5 02/17/2023           Assessment & Plan:   Preventative health maintenance:  Encouraged her to get a flu shot in the fall Tetanus UTD Encouraged her to get her COVID booster Encouraged her to get her second Shingrix vaccine Pap smear UTD Mammogram will be ordered at her follow-up appointment in 6 months Colon screening UTD Encouraged her to consume a balanced diet and exercise regimen Advised her to see an eye doctor and dentist annually Will check CBC, c-Met, lipid, A1c today  Chronic right hip pain:  Likely bursitis Avoid overuse Rx for naproxen 500 mg twice daily x 7 days Consider x-ray right hip if symptoms persist or worsen  RTC in 6 months, follow-up chronic conditions Angeline Laura, NP

## 2024-02-20 NOTE — Patient Instructions (Signed)

## 2024-02-21 ENCOUNTER — Ambulatory Visit: Payer: Self-pay | Admitting: Internal Medicine

## 2024-02-21 LAB — COMPREHENSIVE METABOLIC PANEL WITH GFR
ALT: 25 IU/L (ref 0–32)
AST: 21 IU/L (ref 0–40)
Albumin: 4.4 g/dL (ref 3.8–4.9)
Alkaline Phosphatase: 66 IU/L (ref 44–121)
BUN/Creatinine Ratio: 17 (ref 9–23)
BUN: 14 mg/dL (ref 6–24)
Bilirubin Total: 0.5 mg/dL (ref 0.0–1.2)
CO2: 19 mmol/L — ABNORMAL LOW (ref 20–29)
Calcium: 9.8 mg/dL (ref 8.7–10.2)
Chloride: 103 mmol/L (ref 96–106)
Creatinine, Ser: 0.83 mg/dL (ref 0.57–1.00)
Globulin, Total: 2.2 g/dL (ref 1.5–4.5)
Glucose: 85 mg/dL (ref 70–99)
Potassium: 4.6 mmol/L (ref 3.5–5.2)
Sodium: 138 mmol/L (ref 134–144)
Total Protein: 6.6 g/dL (ref 6.0–8.5)
eGFR: 84 mL/min/{1.73_m2} (ref >59)

## 2024-02-21 LAB — CBC
Hematocrit: 40.3 % (ref 34.0–46.6)
Hemoglobin: 13.3 g/dL (ref 11.1–15.9)
MCH: 31.3 pg (ref 26.6–33.0)
MCHC: 33 g/dL (ref 31.5–35.7)
MCV: 95 fL (ref 79–97)
Platelets: 195 10*3/uL (ref 150–450)
RBC: 4.25 x10E6/uL (ref 3.77–5.28)
RDW: 12.7 % (ref 11.7–15.4)
WBC: 5 10*3/uL (ref 3.4–10.8)

## 2024-02-21 LAB — LIPID PANEL
Chol/HDL Ratio: 3.1 ratio (ref 0.0–4.4)
Cholesterol, Total: 185 mg/dL (ref 100–199)
HDL: 60 mg/dL (ref >39)
LDL Chol Calc (NIH): 115 mg/dL — ABNORMAL HIGH (ref 0–99)
Triglycerides: 50 mg/dL (ref 0–149)
VLDL Cholesterol Cal: 10 mg/dL (ref 5–40)

## 2024-02-21 LAB — HEMOGLOBIN A1C
Est. average glucose Bld gHb Est-mCnc: 103 mg/dL
Hgb A1c MFr Bld: 5.2 % (ref 4.8–5.6)

## 2024-03-22 ENCOUNTER — Other Ambulatory Visit: Payer: Self-pay | Admitting: Internal Medicine

## 2024-03-23 NOTE — Telephone Encounter (Signed)
 Requested Prescriptions  Pending Prescriptions Disp Refills   traZODone  (DESYREL ) 50 MG tablet [Pharmacy Med Name: TRAZODONE  50MG  TABLETS] 135 tablet 1    Sig: TAKE 1 TO 1 AND 1/2 TABLETS(50 TO 75 MG) BY MOUTH AT BEDTIME AS NEEDED FOR SLEEP     Psychiatry: Antidepressants - Serotonin Modulator Passed - 03/23/2024 12:24 PM      Passed - Valid encounter within last 6 months    Recent Outpatient Visits           1 month ago Encounter for general adult medical examination with abnormal findings   [facility] [place], Angeline ORN, NP

## 2024-06-19 ENCOUNTER — Encounter: Payer: Self-pay | Admitting: Internal Medicine

## 2024-06-27 ENCOUNTER — Other Ambulatory Visit: Payer: Self-pay | Admitting: Internal Medicine

## 2024-06-27 DIAGNOSIS — Z1231 Encounter for screening mammogram for malignant neoplasm of breast: Secondary | ICD-10-CM

## 2024-07-18 ENCOUNTER — Encounter: Payer: Self-pay | Admitting: Internal Medicine

## 2024-07-18 DIAGNOSIS — M9901 Segmental and somatic dysfunction of cervical region: Secondary | ICD-10-CM | POA: Diagnosis not present

## 2024-07-18 DIAGNOSIS — M5033 Other cervical disc degeneration, cervicothoracic region: Secondary | ICD-10-CM | POA: Diagnosis not present

## 2024-07-18 DIAGNOSIS — M9902 Segmental and somatic dysfunction of thoracic region: Secondary | ICD-10-CM | POA: Diagnosis not present

## 2024-07-18 DIAGNOSIS — M5414 Radiculopathy, thoracic region: Secondary | ICD-10-CM | POA: Diagnosis not present

## 2024-08-08 ENCOUNTER — Other Ambulatory Visit: Payer: Self-pay | Admitting: Internal Medicine

## 2024-08-08 ENCOUNTER — Inpatient Hospital Stay: Admission: RE | Admit: 2024-08-08 | Discharge: 2024-08-08 | Attending: Internal Medicine | Admitting: Internal Medicine

## 2024-08-08 DIAGNOSIS — Z1231 Encounter for screening mammogram for malignant neoplasm of breast: Secondary | ICD-10-CM | POA: Insufficient documentation

## 2024-08-11 NOTE — Telephone Encounter (Signed)
 Requested medication (s) are due for refill today: Yes  Requested medication (s) are on the active medication list: Yes  Last refill:  08/01/23  Future visit scheduled: Yes  Notes to clinic:  Unable to refill due to no refill protocol for this medication.      Requested Prescriptions  Pending Prescriptions Disp Refills   minocycline  (MINOCIN ) 50 MG capsule [Pharmacy Med Name: MINOCYCLINE  50MG  CAPSULES] 90 capsule 1    Sig: TAKE 1 CAPSULE(50 MG) BY MOUTH TWICE DAILY     Off-Protocol Failed - 08/11/2024  8:28 AM      Failed - Medication not assigned to a protocol, review manually.      Passed - Valid encounter within last 12 months    Recent Outpatient Visits           5 months ago Encounter for general adult medical examination with abnormal findings   [facility] [place], [place], [place]

## 2024-08-15 ENCOUNTER — Encounter: Payer: Self-pay | Admitting: Internal Medicine

## 2024-08-17 ENCOUNTER — Other Ambulatory Visit: Payer: Self-pay

## 2024-08-17 MED ORDER — MINOCYCLINE HCL 50 MG PO CAPS: 50.0000 mg | ORAL_CAPSULE | Freq: Two times a day (BID) | ORAL | 1 refills | Status: AC

## 2024-08-21 NOTE — Progress Notes (Unsigned)
 "  Subjective:    Patient ID: Maria Thornton, female    DOB: 11-02-1970, 53 y.o.   MRN: [id number]  HPI  Patient presents to clinic today for 42-month follow-up of chronic conditions.   Anxiety: Chronic, managed on venlafaxine .  She is not currently seeing a therapist.  She denies depression, SI/HI.   Insomnia: She has difficulty staying asleep.  She is taking trazodone  as prescribed with good relief of symptoms.  There is no sleep study on file.   HLD: Her last LDL was 115, triglycerides 50, 01/2024.  She is not taking any cholesterol-lowering medication at this time.  She tries to consume a low-fat diet.  Acne:  Managed with minocycline . She follow with dermatology.  Review of Systems     Past Medical History:  Diagnosis Date   Allergy    Seasonal   Anxiety    Heart murmur    no proph   History of asthma    in the past, very mild   History of Bell's palsy    years ago   Multiple allergies     Current Outpatient Medications  Medication Sig Dispense Refill   Ascorbic Acid (VITAMIN C) 1000 MG tablet      fluticasone  (FLONASE ) 50 MCG/ACT nasal spray 2 sprays each nostril once daily 2 sprays each nostril once daily 16 g 3   Magnesium Citrate 200 MG TABS Take by mouth.     metroNIDAZOLE  (METROGEL ) 1 % gel Apply topically daily. 45 g 0   minocycline  (MINOCIN ) 50 MG capsule Take 1 capsule (50 mg total) by mouth 2 (two) times daily. 90 capsule 1   naproxen  (NAPROSYN ) 500 MG tablet Take 1 tablet (500 mg total) by mouth 2 (two) times daily with a meal. 14 tablet 0   traZODone  (DESYREL ) 50 MG tablet TAKE 1 TO 1 AND 1/2 TABLETS(50 TO 75 MG) BY MOUTH AT BEDTIME AS NEEDED FOR SLEEP 135 tablet 1   venlafaxine  XR (EFFEXOR -XR) 75 MG 24 hr capsule Take 1 capsule (75 mg total) by mouth daily with breakfast. 90 capsule 1   ZEPBOUND 5 MG/0.5ML Pen Inject 5 mg into the skin once a week.     No current facility-administered medications for this visit.    Allergies  Allergen Reactions    Sulfonamide Derivatives     REACTION: rash    Family History  Problem Relation Age of Onset   Arthritis Mother        rheumatoid   Hypertension Mother    COPD Mother    Hypertension Father    COPD Father    Diabetes Father        type 2   Hyperlipidemia Father    Hypertension Sister    Healthy Brother    Diabetes Maternal Grandmother    Asthma Maternal Grandmother    Arthritis Maternal Grandfather        rheumatoid   Hyperlipidemia Maternal Grandfather    Hypertension Maternal Grandfather    Alcohol abuse Maternal Grandfather    Arthritis Paternal Grandmother    Breast cancer Maternal Aunt 53       Invasive Lobular Carcinoma    Alcohol abuse Maternal Aunt    Cancer Maternal Aunt     Social History   Socioeconomic History   Marital status: Married    Spouse name: Not on file   Number of children: 2   Years of education: Not on file   Highest education level: Bachelor's degree (e.g., BA, AB, BS)  Occupational History   Occupation: Event Organiser: [employer]  Tobacco Use   Smoking status: Never   Smokeless tobacco: Never  Vaping Use   Vaping status: Never Used  Substance and Sexual Activity   Alcohol use: Yes   Drug use: No   Sexual activity: Not on file  Other Topics Concern   Not on file  Social History Narrative   No regular exercise.   Social Drivers of Health   Tobacco Use: Low Risk (02/20/2024)   Patient History    Smoking Tobacco Use: Never    Smokeless Tobacco Use: Never    Passive Exposure: Not on file  Financial Resource Strain: Low Risk (02/19/2024)   Overall Financial Resource Strain (CARDIA)    Difficulty of Paying Living Expenses: Not hard at all  Food Insecurity: No Food Insecurity (02/19/2024)   Epic    Worried About Radiation Protection Practitioner of Food in the Last Year: Never true    Ran Out of Food in the Last Year: Never true  Transportation Needs: No Transportation Needs (02/19/2024)   Epic    Lack of Transportation (Medical): No    Lack of  Transportation (Non-Medical): No  Physical Activity: Insufficiently Active (02/19/2024)   Exercise Vital Sign    Days of Exercise per Week: 2 days    Minutes of Exercise per Session: 30 min  Stress: No Stress Concern Present (02/19/2024)   [employer] of Occupational Health - Occupational Stress Questionnaire    Feeling of Stress: Only a little  Social Connections: Moderately Isolated (02/19/2024)   Social Connection and Isolation Panel    Frequency of Communication with Friends and Family: More than three times a week    Frequency of Social Gatherings with Friends and Family: Twice a week    Attends Religious Services: Never    Database Administrator or Organizations: No    Attends Engineer, Structural: Not on file    Marital Status: Married  Catering Manager Violence: Not on file  Depression (PHQ2-9): Low Risk (02/20/2024)   Depression (PHQ2-9)    PHQ-2 Score: 0  Alcohol Screen: Low Risk (02/19/2024)   Alcohol Screen    Last Alcohol Screening Score (AUDIT): 2  Housing: Low Risk (02/19/2024)   Epic    Unable to Pay for Housing in the Last Year: No    Number of Times Moved in the Last Year: 0    Homeless in the Last Year: No  Utilities: Not on file  Health Literacy: Not on file     Constitutional: Denies denies fever, malaise, fatigue, headache or abrupt weight changes.  HEENT: Denies eye pain, eye redness, ear pain, ringing in the ears, wax buildup, runny nose, nasal congestion, bloody nose, or sore throat. Respiratory: Denies difficulty breathing, shortness of breath, cough or sputum production.   Cardiovascular: Patient reports intermittent swelling in legs.  Denies chest pain, chest tightness, palpitations or swelling in the hands.  Gastrointestinal: Denies abdominal pain, bloating, constipation, diarrhea or blood in the stool.  GU: Denies urgency, frequency, pain with urination, burning sensation, blood in urine, odor or discharge. Musculoskeletal: Denies decrease  in range of motion, difficulty with gait, muscle pain or joint pain and swelling.  Skin: Pt reports acne. Denies redness, rashes, or ulcercations.  Neurological: Patient reports insomnia.  Denies dizziness, difficulty with memory, difficulty with speech or problems with balance and coordination.  Psych: Patient has a history of anxiety.  Denies depression, SI/HI.  No other specific complaints in  a complete review of systems (except as listed in HPI above).  Objective:   Physical Exam  LMP 03/20/2018    Wt Readings from Last 3 Encounters:  02/20/24 164 lb (74.4 kg)  08/22/23 187 lb 6.4 oz (85 kg)  02/17/23 182 lb (82.6 kg)    General: Appears her stated age, obese in NAD. Skin: Warm, dry and intact.  Cardiovascular: Normal rate and rhythm. S1,S2 noted.  No murmur, rubs or gallops noted.  Pulmonary/Chest: Normal effort and positive vesicular breath sounds. No respiratory distress. No wheezes, rales or ronchi noted.  Musculoskeletal: No difficulty with gait.  Neurological: Alert and oriented. Coordination normal.     BMET    Component Value Date/Time   NA 138 02/20/2024 1022   K 4.6 02/20/2024 1022   CL 103 02/20/2024 1022   CO2 19 (L) 02/20/2024 1022   GLUCOSE 85 02/20/2024 1022   BUN 14 02/20/2024 1022   CREATININE 0.83 02/20/2024 1022   CALCIUM 9.8 02/20/2024 1022   GFRNONAA 78 02/05/2020 0938   GFRAA 90 02/05/2020 0938    Lipid Panel     Component Value Date/Time   CHOL 185 02/20/2024 1022   TRIG 50 02/20/2024 1022   HDL 60 02/20/2024 1022   CHOLHDL 3.1 02/20/2024 1022   LDLCALC 115 (H) 02/20/2024 1022    CBC    Component Value Date/Time   WBC 5.0 02/20/2024 1022   RBC 4.25 02/20/2024 1022   HGB 13.3 02/20/2024 1022   HCT 40.3 02/20/2024 1022   PLT 195 02/20/2024 1022   MCV 95 02/20/2024 1022   MCH 31.3 02/20/2024 1022   MCHC 33.0 02/20/2024 1022   RDW 12.7 02/20/2024 1022   LYMPHSABS 1.7 04/01/2016 0000   EOSABS 0.3 04/01/2016 0000   BASOSABS 0.1  04/01/2016 0000    Hgb A1C Lab Results  Component Value Date   HGBA1C 5.2 02/20/2024            Assessment & Plan:     RTC in 6 months for your annual exam Angeline Laura, NP  "

## 2024-08-22 ENCOUNTER — Encounter: Payer: Self-pay | Admitting: Internal Medicine

## 2024-08-22 ENCOUNTER — Ambulatory Visit: Admitting: Internal Medicine

## 2024-08-22 VITALS — BP 118/78 | Ht 63.0 in | Wt 149.8 lb

## 2024-08-22 DIAGNOSIS — L709 Acne, unspecified: Secondary | ICD-10-CM

## 2024-08-22 DIAGNOSIS — M25551 Pain in right hip: Secondary | ICD-10-CM

## 2024-08-22 DIAGNOSIS — F5101 Primary insomnia: Secondary | ICD-10-CM

## 2024-08-22 DIAGNOSIS — E7800 Pure hypercholesterolemia, unspecified: Secondary | ICD-10-CM

## 2024-08-22 DIAGNOSIS — G8929 Other chronic pain: Secondary | ICD-10-CM | POA: Insufficient documentation

## 2024-08-22 DIAGNOSIS — Z8261 Family history of arthritis: Secondary | ICD-10-CM

## 2024-08-22 DIAGNOSIS — F419 Anxiety disorder, unspecified: Secondary | ICD-10-CM

## 2024-08-22 DIAGNOSIS — M79641 Pain in right hand: Secondary | ICD-10-CM | POA: Insufficient documentation

## 2024-08-22 DIAGNOSIS — E663 Overweight: Secondary | ICD-10-CM

## 2024-08-22 DIAGNOSIS — Z6826 Body mass index (BMI) 26.0-26.9, adult: Secondary | ICD-10-CM

## 2024-08-22 DIAGNOSIS — M25562 Pain in left knee: Secondary | ICD-10-CM | POA: Diagnosis not present

## 2024-08-22 DIAGNOSIS — M79642 Pain in left hand: Secondary | ICD-10-CM

## 2024-08-22 NOTE — Assessment & Plan Note (Signed)
 Continue minocycline  50 mg twice daily as needed She will continue to follow with dermatology

## 2024-08-22 NOTE — Assessment & Plan Note (Signed)
 Continue trazodone  75 mg nightly We will monitor

## 2024-08-22 NOTE — Assessment & Plan Note (Signed)
 Lipid profile today Encouraged her to consume a low fat diet

## 2024-08-22 NOTE — Assessment & Plan Note (Signed)
 Encouraged diet and exercise for weight loss ?

## 2024-08-22 NOTE — Patient Instructions (Signed)
 Joint Pain  Joint pain can be caused by many things. It may go away if you follow instructions from your health care provider for taking care of yourself at home. Sometimes, you may need more treatment. Joint pain can be caused by: Bruises at the area of the joint. An injury caused by movements that are repeated. Wear and tear on the joint as you get older. Buildup of uric acid crystals in the joint. This is also called gout. Irritation and swelling of the joint. Types of arthritis. Infections of the joint or of the bone. Your provider may tell you to take pain medicine or wear an elastic bandage, sling, or splint. If your joint pain continues, you may need lab or imaging tests to find the cause of your joint pain. Follow these instructions at home: If you have an elastic bandage, sling, or splint that can be taken off: Wear the bandage, sling, or splint as told by your provider. Take it off only if your provider says you can. Check the skin under and around it every day. Tell your provider if you see problems. Loosen it if your fingers or toes tingle, are numb, or turn cold and blue. Keep it clean and dry. Ask your provider if you should remove it before bathing. If the bandage, sling, or splint is not waterproof: Do not let it get wet. Cover it when you take a bath or shower. Use a cover that does not let any water in. Managing pain, stiffness, and swelling     If told, put ice on the area. If you have an elastic bandage, sling, or splint that you can take off, remove it as told. Put ice in a plastic bag. Place a towel between your skin and the bag. Leave the ice on for 20 minutes, 2-3 times a day. If told, put heat on the area. Do this as often as told. Use the heat source that your provider recommends, such as a moist heat pack or a heating pad. Place a towel between your skin and the heat source. Leave the heat on for 20-30 minutes. If your skin turns bright red, take off the  ice or heat right away to prevent skin damage. The risk of damage is higher if you can't feel pain, heat, or cold. Move your fingers or toes often to reduce stiffness and swelling. Raise the injured area above the level of your heart while you're sitting or lying down. Use a pillow to support the painful area as needed. Activity Rest the painful joint as told. Do not do things that cause pain or make pain worse. Begin exercising or stretching the affected area as told by your provider. Return to normal activities when you are told. Ask what things are safe for you to do. General instructions Take your medicines as told by your provider. Treatment may include medicines for pain and swelling that are taken by mouth or applied to the skin. Do not smoke, vape, or use products with nicotine or tobacco in them. If you need help quitting, talk with your provider. Keep all follow-up visits. Your provider will want to check on your condition. Contact a health care provider if: You have pain that does not get better with medicine. Your joint pain does not improve within 3 days. You have more bruising or swelling. You have a fever. You lose 10 lb (4.5 kg) or more without trying. Get help right away if: You cannot move the joint. Your fingers  or toes tingle, become numb, or turn cold and blue. You have a fever along with a joint that's red, warm, and swollen. This information is not intended to replace advice given to you by your health care provider. Make sure you discuss any questions you have with your health care provider. Document Revised: 05/12/2023 Document Reviewed: 10/22/2022 Elsevier Patient Education  2024 [employer].

## 2024-08-22 NOTE — Assessment & Plan Note (Signed)
 Continue venlafaxine  75 mg daily Support offered

## 2024-08-24 ENCOUNTER — Ambulatory Visit: Payer: Self-pay | Admitting: Internal Medicine

## 2024-08-24 LAB — SEDIMENTATION RATE: Sed Rate: 2 mm/h (ref 0–40)

## 2024-08-24 LAB — LIPID PANEL
Chol/HDL Ratio: 2.7 ratio (ref 0.0–4.4)
Cholesterol, Total: 189 mg/dL (ref 100–199)
HDL: 69 mg/dL
LDL Chol Calc (NIH): 109 mg/dL — ABNORMAL HIGH (ref 0–99)
Triglycerides: 59 mg/dL (ref 0–149)
VLDL Cholesterol Cal: 11 mg/dL (ref 5–40)

## 2024-08-24 LAB — C-REACTIVE PROTEIN: CRP: <1 mg/L (ref 0–10)

## 2024-08-24 LAB — RHEUMATOID FACTOR: Rheumatoid fact SerPl-aCnc: <10 [IU]/mL

## 2024-08-24 LAB — ANA: Anti Nuclear Antibody (ANA): NEGATIVE

## 2024-08-27 ENCOUNTER — Ambulatory Visit
Admission: RE | Admit: 2024-08-27 | Discharge: 2024-08-27 | Disposition: A | Discharge status: Home / Self Care | Source: Ambulatory Visit | Attending: Internal Medicine | Admitting: Internal Medicine

## 2024-08-27 DIAGNOSIS — G8929 Other chronic pain: Secondary | ICD-10-CM | POA: Insufficient documentation

## 2024-08-27 DIAGNOSIS — M25562 Pain in left knee: Secondary | ICD-10-CM | POA: Diagnosis present

## 2024-08-27 DIAGNOSIS — M25551 Pain in right hip: Secondary | ICD-10-CM | POA: Diagnosis present

## 2024-08-27 DIAGNOSIS — M79641 Pain in right hand: Secondary | ICD-10-CM | POA: Diagnosis present

## 2024-08-27 DIAGNOSIS — M79642 Pain in left hand: Secondary | ICD-10-CM | POA: Insufficient documentation

## 2024-08-27 DIAGNOSIS — Z8261 Family history of arthritis: Secondary | ICD-10-CM | POA: Diagnosis present

## 2024-09-28 ENCOUNTER — Other Ambulatory Visit: Payer: Self-pay | Admitting: Internal Medicine

## 2024-09-28 ENCOUNTER — Encounter: Payer: Self-pay | Admitting: Internal Medicine

## 2025-02-20 ENCOUNTER — Encounter: Admitting: Internal Medicine
